# Patient Record
Sex: Male | Born: 1945
Health system: Southern US, Community
[De-identification: ages and names within clinical notes are randomized; demographics above are authoritative.]

## PROBLEM LIST (undated history)

## (undated) DIAGNOSIS — R0609 Other forms of dyspnea: Secondary | ICD-10-CM

## (undated) DIAGNOSIS — E785 Hyperlipidemia, unspecified: Secondary | ICD-10-CM

## (undated) DIAGNOSIS — N189 Chronic kidney disease, unspecified: Secondary | ICD-10-CM

## (undated) DIAGNOSIS — D72819 Decreased white blood cell count, unspecified: Secondary | ICD-10-CM

## (undated) DIAGNOSIS — D693 Immune thrombocytopenic purpura: Secondary | ICD-10-CM

## (undated) DIAGNOSIS — R300 Dysuria: Secondary | ICD-10-CM

## (undated) DIAGNOSIS — Z85819 Personal history of malignant neoplasm of unspecified site of lip, oral cavity, and pharynx: Secondary | ICD-10-CM

## (undated) DIAGNOSIS — R0600 Dyspnea, unspecified: Secondary | ICD-10-CM

## (undated) DIAGNOSIS — I10 Essential (primary) hypertension: Secondary | ICD-10-CM

## (undated) DIAGNOSIS — J439 Emphysema, unspecified: Secondary | ICD-10-CM

## (undated) HISTORY — DX: Dyspnea, unspecified: R06.00

## (undated) HISTORY — PX: LUNG SURGERY: SHX703

## (undated) HISTORY — DX: Other forms of dyspnea: R06.09

## (undated) HISTORY — PX: SKIN CANCER EXCISION: SHX779

## (undated) HISTORY — DX: Hyperlipidemia, unspecified: E78.5

## (undated) HISTORY — DX: Dysuria: R30.0

---

## 1998-03-12 HISTORY — PX: THROAT SURGERY: SHX803

## 2015-03-18 DIAGNOSIS — N400 Enlarged prostate without lower urinary tract symptoms: Secondary | ICD-10-CM | POA: Diagnosis not present

## 2015-03-18 DIAGNOSIS — E039 Hypothyroidism, unspecified: Secondary | ICD-10-CM | POA: Diagnosis not present

## 2015-03-18 DIAGNOSIS — Z85828 Personal history of other malignant neoplasm of skin: Secondary | ICD-10-CM | POA: Diagnosis not present

## 2015-03-18 DIAGNOSIS — N183 Chronic kidney disease, stage 3 (moderate): Secondary | ICD-10-CM | POA: Diagnosis not present

## 2015-03-18 DIAGNOSIS — M109 Gout, unspecified: Secondary | ICD-10-CM | POA: Diagnosis not present

## 2015-03-18 DIAGNOSIS — I10 Essential (primary) hypertension: Secondary | ICD-10-CM | POA: Diagnosis not present

## 2015-03-18 DIAGNOSIS — R739 Hyperglycemia, unspecified: Secondary | ICD-10-CM | POA: Diagnosis not present

## 2015-03-18 DIAGNOSIS — E785 Hyperlipidemia, unspecified: Secondary | ICD-10-CM | POA: Diagnosis not present

## 2015-05-18 DIAGNOSIS — L814 Other melanin hyperpigmentation: Secondary | ICD-10-CM | POA: Diagnosis not present

## 2015-05-18 DIAGNOSIS — D225 Melanocytic nevi of trunk: Secondary | ICD-10-CM | POA: Diagnosis not present

## 2015-05-18 DIAGNOSIS — L910 Hypertrophic scar: Secondary | ICD-10-CM | POA: Diagnosis not present

## 2015-05-18 DIAGNOSIS — L821 Other seborrheic keratosis: Secondary | ICD-10-CM | POA: Diagnosis not present

## 2015-05-18 DIAGNOSIS — D1801 Hemangioma of skin and subcutaneous tissue: Secondary | ICD-10-CM | POA: Diagnosis not present

## 2015-05-18 DIAGNOSIS — L738 Other specified follicular disorders: Secondary | ICD-10-CM | POA: Diagnosis not present

## 2015-05-18 DIAGNOSIS — L308 Other specified dermatitis: Secondary | ICD-10-CM | POA: Diagnosis not present

## 2015-05-18 DIAGNOSIS — L570 Actinic keratosis: Secondary | ICD-10-CM | POA: Diagnosis not present

## 2015-05-18 DIAGNOSIS — D229 Melanocytic nevi, unspecified: Secondary | ICD-10-CM | POA: Diagnosis not present

## 2015-05-18 DIAGNOSIS — D485 Neoplasm of uncertain behavior of skin: Secondary | ICD-10-CM | POA: Diagnosis not present

## 2015-09-22 DIAGNOSIS — H2513 Age-related nuclear cataract, bilateral: Secondary | ICD-10-CM | POA: Diagnosis not present

## 2015-09-26 DIAGNOSIS — H53143 Visual discomfort, bilateral: Secondary | ICD-10-CM | POA: Diagnosis not present

## 2015-11-18 DIAGNOSIS — D1801 Hemangioma of skin and subcutaneous tissue: Secondary | ICD-10-CM | POA: Diagnosis not present

## 2015-11-18 DIAGNOSIS — Z85820 Personal history of malignant melanoma of skin: Secondary | ICD-10-CM | POA: Diagnosis not present

## 2015-11-18 DIAGNOSIS — D225 Melanocytic nevi of trunk: Secondary | ICD-10-CM | POA: Diagnosis not present

## 2015-11-18 DIAGNOSIS — L438 Other lichen planus: Secondary | ICD-10-CM | POA: Diagnosis not present

## 2015-11-18 DIAGNOSIS — D2262 Melanocytic nevi of left upper limb, including shoulder: Secondary | ICD-10-CM | POA: Diagnosis not present

## 2015-11-18 DIAGNOSIS — Z85828 Personal history of other malignant neoplasm of skin: Secondary | ICD-10-CM | POA: Diagnosis not present

## 2015-11-18 DIAGNOSIS — L570 Actinic keratosis: Secondary | ICD-10-CM | POA: Diagnosis not present

## 2015-11-18 DIAGNOSIS — D692 Other nonthrombocytopenic purpura: Secondary | ICD-10-CM | POA: Diagnosis not present

## 2015-11-18 DIAGNOSIS — L821 Other seborrheic keratosis: Secondary | ICD-10-CM | POA: Diagnosis not present

## 2015-12-06 DIAGNOSIS — Z23 Encounter for immunization: Secondary | ICD-10-CM | POA: Diagnosis not present

## 2016-01-16 DIAGNOSIS — N183 Chronic kidney disease, stage 3 (moderate): Secondary | ICD-10-CM | POA: Diagnosis not present

## 2016-01-16 DIAGNOSIS — Z0000 Encounter for general adult medical examination without abnormal findings: Secondary | ICD-10-CM | POA: Diagnosis not present

## 2016-01-16 DIAGNOSIS — N400 Enlarged prostate without lower urinary tract symptoms: Secondary | ICD-10-CM | POA: Diagnosis not present

## 2016-01-16 DIAGNOSIS — E039 Hypothyroidism, unspecified: Secondary | ICD-10-CM | POA: Diagnosis not present

## 2016-01-16 DIAGNOSIS — Z8501 Personal history of malignant neoplasm of esophagus: Secondary | ICD-10-CM | POA: Diagnosis not present

## 2016-01-16 DIAGNOSIS — I10 Essential (primary) hypertension: Secondary | ICD-10-CM | POA: Diagnosis not present

## 2016-01-16 DIAGNOSIS — E785 Hyperlipidemia, unspecified: Secondary | ICD-10-CM | POA: Diagnosis not present

## 2016-01-16 DIAGNOSIS — M109 Gout, unspecified: Secondary | ICD-10-CM | POA: Diagnosis not present

## 2016-01-16 DIAGNOSIS — Z85828 Personal history of other malignant neoplasm of skin: Secondary | ICD-10-CM | POA: Diagnosis not present

## 2016-02-08 DIAGNOSIS — E785 Hyperlipidemia, unspecified: Secondary | ICD-10-CM | POA: Diagnosis not present

## 2016-02-08 DIAGNOSIS — E039 Hypothyroidism, unspecified: Secondary | ICD-10-CM | POA: Diagnosis not present

## 2016-02-08 DIAGNOSIS — I129 Hypertensive chronic kidney disease with stage 1 through stage 4 chronic kidney disease, or unspecified chronic kidney disease: Secondary | ICD-10-CM | POA: Diagnosis not present

## 2016-02-08 DIAGNOSIS — E669 Obesity, unspecified: Secondary | ICD-10-CM | POA: Diagnosis not present

## 2016-02-08 DIAGNOSIS — N183 Chronic kidney disease, stage 3 (moderate): Secondary | ICD-10-CM | POA: Diagnosis not present

## 2016-04-11 DIAGNOSIS — L821 Other seborrheic keratosis: Secondary | ICD-10-CM | POA: Diagnosis not present

## 2016-04-11 DIAGNOSIS — Z85828 Personal history of other malignant neoplasm of skin: Secondary | ICD-10-CM | POA: Diagnosis not present

## 2016-04-11 DIAGNOSIS — L820 Inflamed seborrheic keratosis: Secondary | ICD-10-CM | POA: Diagnosis not present

## 2016-04-22 ENCOUNTER — Inpatient Hospital Stay (HOSPITAL_COMMUNITY)
Admission: EM | Admit: 2016-04-22 | Discharge: 2016-04-25 | DRG: 871 | Disposition: A | Discharge status: Home / Self Care | Payer: Medicare PPO | Attending: Family Medicine | Admitting: Family Medicine

## 2016-04-22 ENCOUNTER — Encounter (HOSPITAL_COMMUNITY): Payer: Self-pay | Admitting: Emergency Medicine

## 2016-04-22 ENCOUNTER — Emergency Department (HOSPITAL_COMMUNITY): Payer: Medicare PPO

## 2016-04-22 DIAGNOSIS — R7989 Other specified abnormal findings of blood chemistry: Secondary | ICD-10-CM

## 2016-04-22 DIAGNOSIS — I129 Hypertensive chronic kidney disease with stage 1 through stage 4 chronic kidney disease, or unspecified chronic kidney disease: Secondary | ICD-10-CM | POA: Diagnosis present

## 2016-04-22 DIAGNOSIS — Z87891 Personal history of nicotine dependence: Secondary | ICD-10-CM | POA: Diagnosis not present

## 2016-04-22 DIAGNOSIS — Z85819 Personal history of malignant neoplasm of unspecified site of lip, oral cavity, and pharynx: Secondary | ICD-10-CM | POA: Diagnosis not present

## 2016-04-22 DIAGNOSIS — N183 Chronic kidney disease, stage 3 (moderate): Secondary | ICD-10-CM | POA: Diagnosis present

## 2016-04-22 DIAGNOSIS — N390 Urinary tract infection, site not specified: Secondary | ICD-10-CM | POA: Diagnosis present

## 2016-04-22 DIAGNOSIS — Z79899 Other long term (current) drug therapy: Secondary | ICD-10-CM

## 2016-04-22 DIAGNOSIS — R319 Hematuria, unspecified: Secondary | ICD-10-CM | POA: Diagnosis present

## 2016-04-22 DIAGNOSIS — B9620 Unspecified Escherichia coli [E. coli] as the cause of diseases classified elsewhere: Secondary | ICD-10-CM

## 2016-04-22 DIAGNOSIS — J439 Emphysema, unspecified: Secondary | ICD-10-CM | POA: Diagnosis present

## 2016-04-22 DIAGNOSIS — A419 Sepsis, unspecified organism: Secondary | ICD-10-CM | POA: Diagnosis present

## 2016-04-22 DIAGNOSIS — R7881 Bacteremia: Secondary | ICD-10-CM

## 2016-04-22 DIAGNOSIS — D649 Anemia, unspecified: Secondary | ICD-10-CM | POA: Diagnosis present

## 2016-04-22 DIAGNOSIS — D696 Thrombocytopenia, unspecified: Secondary | ICD-10-CM

## 2016-04-22 DIAGNOSIS — N189 Chronic kidney disease, unspecified: Secondary | ICD-10-CM | POA: Diagnosis not present

## 2016-04-22 DIAGNOSIS — R300 Dysuria: Secondary | ICD-10-CM | POA: Diagnosis present

## 2016-04-22 DIAGNOSIS — R6521 Severe sepsis with septic shock: Secondary | ICD-10-CM | POA: Diagnosis present

## 2016-04-22 DIAGNOSIS — R778 Other specified abnormalities of plasma proteins: Secondary | ICD-10-CM

## 2016-04-22 DIAGNOSIS — N3001 Acute cystitis with hematuria: Secondary | ICD-10-CM | POA: Diagnosis not present

## 2016-04-22 DIAGNOSIS — N3000 Acute cystitis without hematuria: Secondary | ICD-10-CM | POA: Diagnosis not present

## 2016-04-22 DIAGNOSIS — R509 Fever, unspecified: Secondary | ICD-10-CM | POA: Diagnosis not present

## 2016-04-22 HISTORY — DX: Chronic kidney disease, unspecified: N18.9

## 2016-04-22 HISTORY — DX: Essential (primary) hypertension: I10

## 2016-04-22 HISTORY — DX: Personal history of malignant neoplasm of unspecified site of lip, oral cavity, and pharynx: Z85.819

## 2016-04-22 HISTORY — DX: Emphysema, unspecified: J43.9

## 2016-04-22 LAB — URINALYSIS, ROUTINE W REFLEX MICROSCOPIC
BILIRUBIN URINE: NEGATIVE
GLUCOSE, UA: NEGATIVE mg/dL
KETONES UR: 5 mg/dL — AB
NITRITE: POSITIVE — AB
PH: 5 (ref 5.0–8.0)
PROTEIN: 30 mg/dL — AB
Specific Gravity, Urine: 1.023 (ref 1.005–1.030)

## 2016-04-22 LAB — COMPREHENSIVE METABOLIC PANEL
ALBUMIN: 4.2 g/dL (ref 3.5–5.0)
ALK PHOS: 60 U/L (ref 38–126)
ALT: 22 U/L (ref 17–63)
AST: 26 U/L (ref 15–41)
Anion gap: 13 (ref 5–15)
BUN: 30 mg/dL — AB (ref 6–20)
CALCIUM: 9.6 mg/dL (ref 8.9–10.3)
CHLORIDE: 105 mmol/L (ref 101–111)
CO2: 20 mmol/L — AB (ref 22–32)
CREATININE: 1.51 mg/dL — AB (ref 0.61–1.24)
GFR calc non Af Amer: 44 mL/min — ABNORMAL LOW (ref >60)
GFR, EST AFRICAN AMERICAN: 51 mL/min — AB (ref >60)
GLUCOSE: 132 mg/dL — AB (ref 65–99)
Potassium: 3.7 mmol/L (ref 3.5–5.1)
SODIUM: 138 mmol/L (ref 135–145)
Total Bilirubin: 1.3 mg/dL — ABNORMAL HIGH (ref 0.3–1.2)
Total Protein: 7.6 g/dL (ref 6.5–8.1)

## 2016-04-22 LAB — CBC WITH DIFFERENTIAL/PLATELET
BASOS ABS: 0 10*3/uL (ref 0.0–0.1)
Basophils Relative: 0 %
EOS PCT: 0 %
Eosinophils Absolute: 0 10*3/uL (ref 0.0–0.7)
HEMATOCRIT: 42.3 % (ref 39.0–52.0)
Hemoglobin: 15.5 g/dL (ref 13.0–17.0)
LYMPHS ABS: 0.2 10*3/uL — AB (ref 0.7–4.0)
LYMPHS PCT: 6 %
MCH: 32.2 pg (ref 26.0–34.0)
MCHC: 36.6 g/dL — ABNORMAL HIGH (ref 30.0–36.0)
MCV: 87.8 fL (ref 78.0–100.0)
MONO ABS: 0.1 10*3/uL (ref 0.1–1.0)
Monocytes Relative: 2 %
NEUTROS ABS: 3.5 10*3/uL (ref 1.7–7.7)
Neutrophils Relative %: 93 %
Platelets: 133 10*3/uL — ABNORMAL LOW (ref 150–400)
RBC: 4.82 MIL/uL (ref 4.22–5.81)
RDW: 12.8 % (ref 11.5–15.5)
WBC: 3.8 10*3/uL — ABNORMAL LOW (ref 4.0–10.5)

## 2016-04-22 LAB — I-STAT CG4 LACTIC ACID, ED
Lactic Acid, Venous: 3.57 mmol/L (ref 0.5–1.9)
Lactic Acid, Venous: 1.73 mmol/L (ref 0.5–1.9)

## 2016-04-22 LAB — PROTIME-INR
INR: 1.05
Prothrombin Time: 13.8 seconds (ref 11.4–15.2)

## 2016-04-22 MED ORDER — NOREPINEPHRINE BITARTRATE 1 MG/ML IV SOLN: 2.0000 ug/kg/min | Freq: Once | INTRAVENOUS | Status: DC

## 2016-04-22 MED ORDER — SODIUM CHLORIDE 0.9 % IV BOLUS (SEPSIS)
1000.0000 mL | Freq: Once | INTRAVENOUS | Status: AC
  Administered 2016-04-22: 1000 mL via INTRAVENOUS

## 2016-04-22 MED ORDER — VANCOMYCIN HCL IN DEXTROSE 1-5 GM/200ML-% IV SOLN
1000.0000 mg | INTRAVENOUS | Status: AC
  Administered 2016-04-22: 1000 mg via INTRAVENOUS
  Filled 2016-04-22: qty 200

## 2016-04-22 MED ORDER — SODIUM CHLORIDE 0.9 % IV BOLUS (SEPSIS)
2000.0000 mL | Freq: Once | INTRAVENOUS | Status: AC
  Administered 2016-04-22: 2000 mL via INTRAVENOUS

## 2016-04-22 MED ORDER — NOREPINEPHRINE BITARTRATE 1 MG/ML IV SOLN
0.0000 ug/min | INTRAVENOUS | Status: DC
  Administered 2016-04-22: 4 ug/min via INTRAVENOUS
  Filled 2016-04-22: qty 4

## 2016-04-22 MED ORDER — DEXTROSE 5 % IV SOLN
1.0000 g | Freq: Three times a day (TID) | INTRAVENOUS | Status: DC
  Administered 2016-04-23 (×2): 1 g via INTRAVENOUS
  Filled 2016-04-22 (×2): qty 1

## 2016-04-22 MED ORDER — VANCOMYCIN HCL IN DEXTROSE 750-5 MG/150ML-% IV SOLN: 750.0000 mg | Freq: Two times a day (BID) | INTRAVENOUS | Status: DC

## 2016-04-22 MED ORDER — DEXTROSE 5 % IV SOLN
2.0000 g | Freq: Once | INTRAVENOUS | Status: AC
  Administered 2016-04-22: 2 g via INTRAVENOUS
  Filled 2016-04-22: qty 2

## 2016-04-22 NOTE — ED Notes (Signed)
3rd request for urine patient unable to provide one

## 2016-04-22 NOTE — ED Notes (Signed)
Pt reports he is unable to urinate at present time. 

## 2016-04-22 NOTE — Progress Notes (Signed)
Pharmacy Antibiotic Note  Karna ChristmasRichard Javier is a 71 y.o. male admitted on 04/22/2016 with UTI/sepsis.   Pharmacy has been consulted for Cefepime/Vancomycin dosing. He is febrile (Tm 102.20F) with elevated LA and complaints of dysuria.     Plan: Cefepime 1gm IV q8h Vancomycin 1 gm IV x1 then 750 mg IV q12h VT=15-20 mg/L Monitor renal function and cx data   Height: 5\' 11"  (180.3 cm) Weight: 210 lb (95.3 kg) IBW/kg (Calculated) : 75.3  Temp (24hrs), Avg:102.4 F (39.1 C), Min:102.4 F (39.1 C), Max:102.4 F (39.1 C)   Recent Labs Lab 04/22/16 1841 04/22/16 1842 04/22/16 2149  WBC  --  3.8*  --   CREATININE  --  1.51*  --   LATICACIDVEN 3.57*  --  1.73    Estimated Creatinine Clearance: 52.1 mL/min (by C-G formula based on SCr of 1.51 mg/dL (H)).    No Known Allergies  Antimicrobials this admission: 2/11 Cefepime >>  2/11 Vancomycin >>  Dose adjustments this admission:  Microbiology results: 2/11 BCx: IP 2/11 UCx: ordered   Thank you for allowing pharmacy to be a part of this patient's care.  Lorenza EvangelistGreen, Corrisa Gibby R 04/22/2016 10:45 PM

## 2016-04-22 NOTE — Progress Notes (Signed)
Pharmacy Antibiotic Note  Vincent ChristmasRichard Coleman is a 71 y.o. male admitted on 04/22/2016 with UTI.  Pharmacy has been consulted for Cefepime dosing. He is febrile (Tm 102.41F) with elevated LA and complaints of dysuria.     Plan: Cefepime 1gm IV q8h Monitor renal function and cx data   Weight: 212 lb (96.2 kg)  Temp (24hrs), Avg:102.4 F (39.1 C), Min:102.4 F (39.1 C), Max:102.4 F (39.1 C)   Recent Labs Lab 04/22/16 1841 04/22/16 1842  WBC  --  3.8*  CREATININE  --  1.51*  LATICACIDVEN 3.57*  --     CrCl cannot be calculated (Unknown ideal weight.).    No Known Allergies  Antimicrobials this admission: 2/11 Cefepime >>   Dose adjustments this admission:  Microbiology results: 2/11 BCx: IP 2/11 UCx: ordered   Thank you for allowing pharmacy to be a part of this patient's care.  Elson ClanLilliston, Lougenia Morrissey Michelle 04/22/2016 7:49 PM

## 2016-04-22 NOTE — ED Notes (Signed)
Patient givenw

## 2016-04-22 NOTE — ED Notes (Signed)
BLADDER SCAN PREFORMED RESULTING IN 170mL's

## 2016-04-22 NOTE — ED Notes (Signed)
Bed: WA17 Expected date:  Expected time:  Means of arrival:  Comments: 71 yo dysuria, fever-given Tylenol en route

## 2016-04-22 NOTE — ED Notes (Addendum)
Bladder scan results: 0 mL 

## 2016-04-22 NOTE — ED Provider Notes (Signed)
[facility]-EMERGENCY DEPT Provider Note   CSN: [id number] Arrival date & time: 04/22/16 1818     History    Chief Complaint  Patient presents with  . Dysuria     HPI Vincent Coleman is a 71 y.o. male.  71yo M w/ HTN And emphysema who presents with malaise, fever, and dysuria. Yesterday he began feeling unwell and this progressed throughout the night. He began having severe dysuria today. He denies any hematuria or associated back or abdominal pain. He was noted to be febrile by EMS and received 1 g Tylenol in transport. He denies any cough/cold symptoms, new shortness of breath, or chest pain.   Past Medical History:  Diagnosis Date  . Emphysema of lung (HCC)   . Hypertension      There are no active problems to display for this patient.   History reviewed. No pertinent surgical history.      Home Medications    Prior to Admission medications   Medication Sig Start Date End Date Taking? Authorizing Provider  levothyroxine (SYNTHROID, LEVOTHROID) 137 MCG tablet Take 137 mcg by mouth daily. 03/12/16  Yes Historical Provider, MD  lisinopril-hydrochlorothiazide (PRINZIDE,ZESTORETIC) 20-12.5 MG tablet Take 1 tablet by mouth daily. 02/14/16  Yes Historical Provider, MD  omeprazole (PRILOSEC) 20 MG capsule Take 20 mg by mouth 2 (two) times daily. 03/12/16  Yes Historical Provider, MD  Pseudoeph-Doxylamine-DM-APAP (NYQUIL PO) Take 30 mLs by mouth as needed (sleep, sore throat).   Yes Historical Provider, MD  rosuvastatin (CRESTOR) 40 MG tablet Take 40 mg by mouth daily. 03/12/16  Yes Historical Provider, MD  VENTOLIN HFA 108 (90 Base) MCG/ACT inhaler Take 2 puffs by mouth every 6 (six) hours. 01/16/16  Yes Historical Provider, MD      History reviewed. No pertinent family history.   Social History  Substance Use Topics  . Smoking status: Former Games developer  . Smokeless tobacco: Never Used  . Alcohol use No     Allergies     Patient has no known allergies.    Review of  Systems  10 Systems reviewed and are negative for acute change except as noted in the HPI.   Physical Exam Updated Vital Signs BP 132/74   Pulse 109   Temp 102.4 F (39.1 C) (Oral)   Resp 23   Ht 5\' 11"  (1.803 m)   Wt 210 lb (95.3 kg)   SpO2 98%   BMI 29.29 kg/m   Physical Exam  Constitutional: He is oriented to person, place, and time. He appears well-developed and well-nourished. No distress.  HENT:  Head: Normocephalic and atraumatic.  dry mucous membranes  Eyes: Conjunctivae are normal. Pupils are equal, round, and reactive to light.  Neck: Neck supple.  Cardiovascular: Regular rhythm and normal heart sounds.  Tachycardia present.   No murmur heard. Pulmonary/Chest: Effort normal and breath sounds normal.  Abdominal: Soft. Bowel sounds are normal. He exhibits no distension. There is no tenderness.  Musculoskeletal: He exhibits no edema.  Neurological: He is alert and oriented to person, place, and time.  Fluent speech  Skin: Skin is warm and dry. There is pallor.  Psychiatric: He has a normal mood and affect. Judgment normal.  Nursing note and vitals reviewed.     ED Treatments / Results  Labs (all labs ordered are listed, but only abnormal results are displayed) Labs Reviewed  COMPREHENSIVE METABOLIC PANEL - Abnormal; Notable for the following:       Result Value   CO2 20 (*)  Glucose, Bld 132 (*)    BUN 30 (*)    Creatinine, Ser 1.51 (*)    Total Bilirubin 1.3 (*)    GFR calc non Af Amer 44 (*)    GFR calc Af Amer 51 (*)    All other components within normal limits  CBC WITH DIFFERENTIAL/PLATELET - Abnormal; Notable for the following:    WBC 3.8 (*)    MCHC 36.6 (*)    Platelets 133 (*)    Lymphs Abs 0.2 (*)    All other components within normal limits  URINALYSIS, ROUTINE W REFLEX MICROSCOPIC - Abnormal; Notable for the following:    APPearance HAZY (*)    Hgb urine dipstick MODERATE (*)    Ketones, ur 5 (*)    Protein, ur 30 (*)    Nitrite  POSITIVE (*)    Leukocytes, UA LARGE (*)    Bacteria, UA RARE (*)    Squamous Epithelial / LPF 0-5 (*)    All other components within normal limits  I-STAT CG4 LACTIC ACID, ED - Abnormal; Notable for the following:    Lactic Acid, Venous 3.57 (*)    All other components within normal limits  I-STAT TROPOININ, ED - Abnormal; Notable for the following:    Troponin i, poc 0.21 (*)    All other components within normal limits  CULTURE, BLOOD (ROUTINE X 2)  CULTURE, BLOOD (ROUTINE X 2)  URINE CULTURE  PROTIME-INR  I-STAT CG4 LACTIC ACID, ED     EKG  EKG Interpretation  Date/Time:    Ventricular Rate:    PR Interval:    QRS Duration:   QT Interval:    QTC Calculation:   R Axis:     Text Interpretation:           Radiology Dg Chest 2 View  Result Date: 04/22/2016 CLINICAL DATA:  Fever for a few days. History of emphysema and hypertension. Evaluate possible sepsis. EXAM: CHEST  2 VIEW COMPARISON:  None. FINDINGS: Cardiomediastinal silhouette is normal. No pleural effusions or focal consolidations. LEFT hilar surgical clips. Trachea projects midline and there is no pneumothorax. Soft tissue planes and included osseous structures are non-suspicious. Old moderate approximate L1 compression fracture. Mild degenerative change of thoracic spine. IMPRESSION: No acute cardiopulmonary process. Electronically Signed   By: Awilda Metroourtnay  Bloomer M.D.   On: 04/22/2016 19:11    Procedures Procedures (including critical care time) .Critical Care Performed by: Laurence SpatesLITTLE, Alaster Asfaw MORGAN Authorized by: Laurence SpatesLITTLE, Stancil Deisher MORGAN   Critical care provider statement:    Critical care time (minutes):  75   Critical care time was exclusive of:  Separately billable procedures and treating other patients   Critical care was necessary to treat or prevent imminent or life-threatening deterioration of the following conditions:  Sepsis   Critical care was time spent personally by me on the following activities:   Development of treatment plan with patient or surrogate, discussions with consultants, evaluation of patient's response to treatment, examination of patient, obtaining history from patient or surrogate, ordering and performing treatments and interventions, ordering and review of laboratory studies, ordering and review of radiographic studies, re-evaluation of patient's condition and review of old charts    Medications Ordered in ED  Medications  ceFEPIme (MAXIPIME) 1 g in dextrose 5 % 50 mL IVPB (not administered)  norepinephrine (LEVOPHED) 4 mg in dextrose 5 % 250 mL (0.016 mg/mL) infusion (4 mcg/min Intravenous New Bag/Given 04/22/16 2235)  vancomycin (VANCOCIN) IVPB 750 mg/150 ml premix (not administered)  ceFEPIme (MAXIPIME) 2 g in dextrose 5 % 50 mL IVPB (0 g Intravenous Stopped 04/22/16 1937)  sodium chloride 0.9 % bolus 1,000 mL (0 mLs Intravenous Stopped 04/22/16 2138)  sodium chloride 0.9 % bolus 2,000 mL (0 mLs Intravenous Stopped 04/22/16 2015)  sodium chloride 0.9 % bolus 1,000 mL (1,000 mLs Intravenous New Bag/Given 04/22/16 2143)  vancomycin (VANCOCIN) IVPB 1000 mg/200 mL premix (0 mg Intravenous Stopped 04/22/16 2354)     Initial Impression / Assessment and Plan / ED Course  I have reviewed the triage vital signs and the nursing notes.  Pertinent labs & imaging results that were available during my care of the patient were reviewed by me and considered in my medical decision making (see chart for details).     Pt p/w fever associated with malaise and dysuria over the past 24 hours. Initial temp 102.4, heart rate 135, BP 119/73. Normal work of breathing and clear breath sounds. Initiated a code sepsis based on fever and gave cefepime to cover pyelonephritis given his dysuria history. Also gave 3L IVF. Initial lactate 3.57. Creatinine 1.51, WBC 3.8, normal hemoglobin. Chest x-ray negative acute.  After 3 L of fluid, the patient remained hypotensive. Gave a fourth liter but he  remained persistently hypotensive and had not produced urine sample. I added vancomycin for broader coverage. Discussed with critical care, Dr. Jamison Neighbor, who recommended 5th L. Gave 5th liter and also began levophed for pressure support. After completion of fifth liter and antibiotics, the patient had produced a urine sample that does confirm UTI. I was later able to titrate the patient off of Levophed. Discussed again w/ CCM, Dr. Delton Coombes, who recommended monitoring blood pressure as if he remains stable he would be appropriate for stepdown.  1:27 AM Pt's BP has remained normal for at least 30 min off levophed and he is mentating well. Discussed admission w/ hospitalist, Dr. Julian Reil, who will admit for further care. Family updated on work up and plan.  Final Clinical Impressions(s) / ED Diagnoses   Final diagnoses:  Sepsis, due to unspecified organism [facility])  Urinary tract infection with hematuria, site unspecified     New Prescriptions   No medications on file       Laurence Spates, MD 04/23/16 [phone]

## 2016-04-22 NOTE — ED Notes (Signed)
In and out placed with no out put

## 2016-04-22 NOTE — ED Triage Notes (Signed)
Pt from home. Reports he began to feel unwell yesterday and got worse throughout the night. Began to have dysuria and bilateral flank pain this am. Febrile with EMS, given 1000mg  acetaminophen by EMS.

## 2016-04-22 NOTE — ED Notes (Signed)
Patient transported to X-ray 

## 2016-04-22 NOTE — ED Notes (Signed)
MADE 2ND REQUEST FOR URINE PATIENT NOT ABLE TO PROVIDE ONE

## 2016-04-22 NOTE — ED Notes (Signed)
Condom cath puy in place waiting for out put

## 2016-04-22 NOTE — ED Notes (Signed)
Made first attempt to collect urine sample, patient unable to provide one at this time.

## 2016-04-23 ENCOUNTER — Inpatient Hospital Stay (HOSPITAL_COMMUNITY): Payer: Medicare PPO

## 2016-04-23 ENCOUNTER — Encounter (HOSPITAL_COMMUNITY): Payer: Self-pay | Admitting: Internal Medicine

## 2016-04-23 DIAGNOSIS — D696 Thrombocytopenia, unspecified: Secondary | ICD-10-CM | POA: Diagnosis present

## 2016-04-23 DIAGNOSIS — Z87891 Personal history of nicotine dependence: Secondary | ICD-10-CM | POA: Diagnosis not present

## 2016-04-23 DIAGNOSIS — N183 Chronic kidney disease, stage 3 (moderate): Secondary | ICD-10-CM

## 2016-04-23 DIAGNOSIS — B9620 Unspecified Escherichia coli [E. coli] as the cause of diseases classified elsewhere: Secondary | ICD-10-CM | POA: Diagnosis present

## 2016-04-23 DIAGNOSIS — N189 Chronic kidney disease, unspecified: Secondary | ICD-10-CM | POA: Diagnosis not present

## 2016-04-23 DIAGNOSIS — R7881 Bacteremia: Secondary | ICD-10-CM | POA: Diagnosis not present

## 2016-04-23 DIAGNOSIS — R7989 Other specified abnormal findings of blood chemistry: Secondary | ICD-10-CM | POA: Diagnosis not present

## 2016-04-23 DIAGNOSIS — D649 Anemia, unspecified: Secondary | ICD-10-CM | POA: Diagnosis present

## 2016-04-23 DIAGNOSIS — R319 Hematuria, unspecified: Secondary | ICD-10-CM | POA: Diagnosis present

## 2016-04-23 DIAGNOSIS — A419 Sepsis, unspecified organism: Secondary | ICD-10-CM | POA: Diagnosis present

## 2016-04-23 DIAGNOSIS — I129 Hypertensive chronic kidney disease with stage 1 through stage 4 chronic kidney disease, or unspecified chronic kidney disease: Secondary | ICD-10-CM | POA: Diagnosis present

## 2016-04-23 DIAGNOSIS — J439 Emphysema, unspecified: Secondary | ICD-10-CM | POA: Diagnosis present

## 2016-04-23 DIAGNOSIS — N3001 Acute cystitis with hematuria: Secondary | ICD-10-CM | POA: Diagnosis not present

## 2016-04-23 DIAGNOSIS — N390 Urinary tract infection, site not specified: Secondary | ICD-10-CM | POA: Diagnosis present

## 2016-04-23 DIAGNOSIS — Z79899 Other long term (current) drug therapy: Secondary | ICD-10-CM | POA: Diagnosis not present

## 2016-04-23 DIAGNOSIS — R6521 Severe sepsis with septic shock: Secondary | ICD-10-CM

## 2016-04-23 DIAGNOSIS — N3000 Acute cystitis without hematuria: Secondary | ICD-10-CM | POA: Diagnosis not present

## 2016-04-23 DIAGNOSIS — Z85819 Personal history of malignant neoplasm of unspecified site of lip, oral cavity, and pharynx: Secondary | ICD-10-CM | POA: Diagnosis not present

## 2016-04-23 DIAGNOSIS — R300 Dysuria: Secondary | ICD-10-CM | POA: Diagnosis present

## 2016-04-23 LAB — ECHOCARDIOGRAM COMPLETE
Height: 71 in
Weight: 3552.05 oz

## 2016-04-23 LAB — BLOOD CULTURE ID PANEL (REFLEXED)
Acinetobacter baumannii: NOT DETECTED
CANDIDA GLABRATA: NOT DETECTED
Candida albicans: NOT DETECTED
Candida krusei: NOT DETECTED
Candida parapsilosis: NOT DETECTED
Candida tropicalis: NOT DETECTED
Carbapenem resistance: NOT DETECTED
ENTEROCOCCUS SPECIES: NOT DETECTED
ESCHERICHIA COLI: DETECTED — AB
Enterobacter cloacae complex: NOT DETECTED
Enterobacteriaceae species: DETECTED — AB
Haemophilus influenzae: NOT DETECTED
Klebsiella oxytoca: NOT DETECTED
Klebsiella pneumoniae: NOT DETECTED
LISTERIA MONOCYTOGENES: NOT DETECTED
NEISSERIA MENINGITIDIS: NOT DETECTED
Proteus species: NOT DETECTED
Pseudomonas aeruginosa: NOT DETECTED
SERRATIA MARCESCENS: NOT DETECTED
STAPHYLOCOCCUS AUREUS BCID: NOT DETECTED
STAPHYLOCOCCUS SPECIES: NOT DETECTED
STREPTOCOCCUS AGALACTIAE: NOT DETECTED
STREPTOCOCCUS PNEUMONIAE: NOT DETECTED
STREPTOCOCCUS SPECIES: NOT DETECTED
Streptococcus pyogenes: NOT DETECTED

## 2016-04-23 LAB — I-STAT TROPONIN, ED: Troponin i, poc: 0.21 ng/mL (ref 0.00–0.08)

## 2016-04-23 LAB — BASIC METABOLIC PANEL
ANION GAP: 7 (ref 5–15)
BUN: 22 mg/dL — ABNORMAL HIGH (ref 6–20)
CALCIUM: 8.1 mg/dL — AB (ref 8.9–10.3)
CO2: 22 mmol/L (ref 22–32)
CREATININE: 1.26 mg/dL — AB (ref 0.61–1.24)
Chloride: 109 mmol/L (ref 101–111)
GFR, EST NON AFRICAN AMERICAN: 55 mL/min — AB (ref >60)
Glucose, Bld: 120 mg/dL — ABNORMAL HIGH (ref 65–99)
Potassium: 4.7 mmol/L (ref 3.5–5.1)
Sodium: 138 mmol/L (ref 135–145)

## 2016-04-23 LAB — CBC
HEMATOCRIT: 38.1 % — AB (ref 39.0–52.0)
Hemoglobin: 13.3 g/dL (ref 13.0–17.0)
MCH: 31.1 pg (ref 26.0–34.0)
MCHC: 34.9 g/dL (ref 30.0–36.0)
MCV: 89 fL (ref 78.0–100.0)
PLATELETS: 129 10*3/uL — AB (ref 150–400)
RBC: 4.28 MIL/uL (ref 4.22–5.81)
RDW: 13.3 % (ref 11.5–15.5)
WBC: 8.7 10*3/uL (ref 4.0–10.5)

## 2016-04-23 LAB — MRSA PCR SCREENING: MRSA by PCR: NEGATIVE

## 2016-04-23 LAB — TROPONIN I
TROPONIN I: 0.2 ng/mL — AB (ref <0.03)
TROPONIN I: 0.22 ng/mL — AB (ref <0.03)
TROPONIN I: 0.19 ng/mL — AB (ref <0.03)

## 2016-04-23 MED ORDER — LIP MEDEX EX OINT
TOPICAL_OINTMENT | CUTANEOUS | Status: DC | PRN
  Filled 2016-04-23: qty 7

## 2016-04-23 MED ORDER — SODIUM CHLORIDE 0.9 % IV SOLN
INTRAVENOUS | Status: DC
  Administered 2016-04-23: 14:00:00 via INTRAVENOUS
  Administered 2016-04-23: 125 mL via INTRAVENOUS
  Administered 2016-04-24: 03:00:00 via INTRAVENOUS

## 2016-04-23 MED ORDER — ZOLPIDEM TARTRATE 5 MG PO TABS
5.0000 mg | ORAL_TABLET | Freq: Every evening | ORAL | Status: DC | PRN
  Administered 2016-04-23 – 2016-04-24 (×2): 5 mg via ORAL
  Filled 2016-04-23 (×2): qty 1

## 2016-04-23 MED ORDER — URELLE 81 MG PO TABS
1.0000 | ORAL_TABLET | Freq: Three times a day (TID) | ORAL | Status: DC | PRN
  Filled 2016-04-23: qty 1

## 2016-04-23 MED ORDER — HYDRALAZINE HCL 20 MG/ML IJ SOLN: 10.0000 mg | INTRAMUSCULAR | Status: DC | PRN

## 2016-04-23 MED ORDER — DEXTROSE 5 % IV SOLN
2.0000 g | INTRAVENOUS | Status: DC
  Administered 2016-04-23 – 2016-04-24 (×2): 2 g via INTRAVENOUS
  Filled 2016-04-23 (×3): qty 2

## 2016-04-23 MED ORDER — ASPIRIN EC 325 MG PO TBEC
325.0000 mg | DELAYED_RELEASE_TABLET | Freq: Once | ORAL | Status: AC
  Administered 2016-04-23: 325 mg via ORAL
  Filled 2016-04-23: qty 1

## 2016-04-23 MED ORDER — IPRATROPIUM-ALBUTEROL 0.5-2.5 (3) MG/3ML IN SOLN: 3.0000 mL | RESPIRATORY_TRACT | Status: DC | PRN

## 2016-04-23 MED ORDER — ROSUVASTATIN CALCIUM 20 MG PO TABS
40.0000 mg | ORAL_TABLET | Freq: Every day | ORAL | Status: DC
  Administered 2016-04-23 – 2016-04-24 (×2): 40 mg via ORAL
  Filled 2016-04-23 (×2): qty 2

## 2016-04-23 MED ORDER — HYDRALAZINE HCL 20 MG/ML IJ SOLN
5.0000 mg | Freq: Once | INTRAMUSCULAR | Status: AC
  Administered 2016-04-23: 5 mg via INTRAVENOUS
  Filled 2016-04-23: qty 1

## 2016-04-23 MED ORDER — ASPIRIN EC 81 MG PO TBEC
81.0000 mg | DELAYED_RELEASE_TABLET | Freq: Every day | ORAL | Status: DC
  Administered 2016-04-24 – 2016-04-25 (×2): 81 mg via ORAL
  Filled 2016-04-23 (×2): qty 1

## 2016-04-23 MED ORDER — ENOXAPARIN SODIUM 40 MG/0.4ML ~~LOC~~ SOLN
40.0000 mg | SUBCUTANEOUS | Status: DC
  Administered 2016-04-23 – 2016-04-25 (×3): 40 mg via SUBCUTANEOUS
  Filled 2016-04-23 (×3): qty 0.4

## 2016-04-23 MED ORDER — LISINOPRIL 20 MG PO TABS
20.0000 mg | ORAL_TABLET | Freq: Every day | ORAL | Status: DC
  Administered 2016-04-23 – 2016-04-25 (×3): 20 mg via ORAL
  Filled 2016-04-23: qty 2
  Filled 2016-04-23 (×2): qty 1
  Filled 2016-04-23: qty 2

## 2016-04-23 MED ORDER — PHENAZOPYRIDINE HCL 100 MG PO TABS
100.0000 mg | ORAL_TABLET | Freq: Three times a day (TID) | ORAL | Status: AC
Start: 2016-04-23 — End: 2016-04-24
  Administered 2016-04-23 (×2): 100 mg via ORAL
  Filled 2016-04-23 (×4): qty 1

## 2016-04-23 MED ORDER — LEVOTHYROXINE SODIUM 25 MCG PO TABS
137.0000 ug | ORAL_TABLET | Freq: Every day | ORAL | Status: DC
  Administered 2016-04-23 – 2016-04-25 (×3): 137 ug via ORAL
  Filled 2016-04-23 (×3): qty 1

## 2016-04-23 MED ORDER — PANTOPRAZOLE SODIUM 40 MG PO TBEC
40.0000 mg | DELAYED_RELEASE_TABLET | Freq: Every day | ORAL | Status: DC
  Administered 2016-04-23 – 2016-04-25 (×3): 40 mg via ORAL
  Filled 2016-04-23 (×3): qty 1

## 2016-04-23 NOTE — Care Management Note (Signed)
Case Management Note  Patient Details  Name: Karna ChristmasRichard Gruwell MRN: [id number] Date of Birth: [birth year]  Subjective/Objective:        sepsis            Action/Plan:Date:  April 23, 2016 Chart reviewed for concurrent status and case management needs. Will continue to follow patient progress. Discharge Planning: following for needs Expected discharge date: [id number] Marcelle SmilingRhonda Davis, BSN, AnthonyRN3, [place]   [phone]  Expected Discharge Date:                  Expected Discharge Plan:  Home/Self Care  In-House Referral:     Discharge planning Services     Post Acute Care Choice:    Choice offered to:     DME Arranged:    DME Agency:     HH Arranged:    HH Agency:     Status of Service:  In process, will continue to follow  If discussed at Long Length of Stay Meetings, dates discussed:    Additional Comments:  Golda AcreDavis, Rhonda Lynn, RN 04/23/2016, 9:04 AM

## 2016-04-23 NOTE — Progress Notes (Signed)
Pharmacy Antibiotic Note  Vincent Coleman is a 71 y.o. male admitted on 04/22/2016 with UTI/sepsis.   Pharmacy was initially consulted for Cefepime/Vancomycin dosing, now narrowing spectrum and consulted to dose ceftriaxone. Given septic criteria on admit and weight > 100kg, will use higher dose at this time.  Plan: Ceftriaxone 2g IV q24h No further dose adjustments needed, Pharmacy will sign off; please reconsult if needed.  Height: 5\' 11"  (180.3 cm) Weight: 222 lb 0.1 oz (100.7 kg) IBW/kg (Calculated) : 75.3  Temp (24hrs), Avg:99.6 F (37.6 C), Min:98.6 F (37 C), Max:102.4 F (39.1 C)   Recent Labs Lab 04/22/16 1841 04/22/16 1842 04/22/16 2149 04/23/16 0401  WBC  --  3.8*  --  8.7  CREATININE  --  1.51*  --  1.26*  LATICACIDVEN 3.57*  --  1.73  --     Estimated Creatinine Clearance: 64.1 mL/min (by C-G formula based on SCr of 1.26 mg/dL (H)).    No Known Allergies  Antimicrobials this admission: 2/11 Cefepime >> 2/12 2/11 Vancomycin >>x1 ED 2/12 Rocephin >>   Dose adjustments this admission:   Microbiology results: 2/11 BCx: sent 2/11 UCx: sent 2/12 MRSA PCR: neg  Thank you for allowing pharmacy to be a part of this patient's care.  Loralee PacasErin Roquel Burgin, PharmD, BCPS Pager: [phone] 04/23/2016 10:51 AM

## 2016-04-23 NOTE — H&P (Signed)
History and Physical    Vincent Coleman AVW:[id number] DOB: [birth year] DOA: 04/22/2016   PCP: Vincent Chick, MD Chief Complaint:  Chief Complaint  Patient presents with  . Dysuria    HPI: Vincent Coleman is a 71 y.o. male with medical history significant of HTN, CKD.  Patient presents to the ED with c/o dysuria, fever, chills.  Symptoms began yesterday evening and night.  Symptoms persisted and worsened.  No hematuria, back pain, abd pain.  ED Course: Patient in septic shock in ED.  Initially hypotensive despite 3L IVF, levophed started, 4th L then 5th L given at instructions of PCCM and levophed was able to be titrated off for the moment.  Patient on cefepime also got 1 dose of vanc.  UA shows UTI.  Review of Systems: As per HPI otherwise 10 point review of systems negative.    Past Medical History:  Diagnosis Date  . CKD (chronic kidney disease)   . Emphysema of lung (HCC)   . History of throat cancer   . Hypertension     History reviewed. No pertinent surgical history.   reports that he has quit smoking. He has never used smokeless tobacco. He reports that he does not drink alcohol or use drugs.  No Known Allergies  History reviewed. No pertinent family history. No h/o kidney problems   Prior to Admission medications   Medication Sig Start Date End Date Taking? Authorizing Provider  levothyroxine (SYNTHROID, LEVOTHROID) 137 MCG tablet Take 137 mcg by mouth daily. 03/12/16  Yes Historical Provider, MD  lisinopril-hydrochlorothiazide (PRINZIDE,ZESTORETIC) 20-12.5 MG tablet Take 1 tablet by mouth daily. 02/14/16  Yes Historical Provider, MD  omeprazole (PRILOSEC) 20 MG capsule Take 20 mg by mouth 2 (two) times daily. 03/12/16  Yes Historical Provider, MD  Pseudoeph-Doxylamine-DM-APAP (NYQUIL PO) Take 30 mLs by mouth as needed (sleep, sore throat).   Yes Historical Provider, MD  rosuvastatin (CRESTOR) 40 MG tablet Take 40 mg by mouth daily. 03/12/16  Yes Historical Provider, MD    VENTOLIN HFA 108 (90 Base) MCG/ACT inhaler Take 2 puffs by mouth every 6 (six) hours. 01/16/16  Yes Historical Provider, MD    Physical Exam: Vitals:   04/23/16 0135 04/23/16 0145 04/23/16 0150 04/23/16 0155  BP: 130/83 141/92 141/81 136/87  Pulse: 110 111 113 112  Resp: 22 22 22 22   Temp:      TempSrc:      SpO2: 97% 97% 98% 98%  Weight:      Height:          Constitutional: NAD, calm, comfortable Eyes: PERRL, lids and conjunctivae normal ENMT: Mucous membranes are moist. Posterior pharynx clear of any exudate or lesions.Normal dentition.  Neck: normal, supple, no masses, no thyromegaly Respiratory: clear to auscultation bilaterally, no wheezing, no crackles. Normal respiratory effort. No accessory muscle use.  Cardiovascular: Regular rate and rhythm, no murmurs / rubs / gallops. No extremity edema. 2+ pedal pulses. No carotid bruits.  Abdomen: no tenderness, no masses palpated. No hepatosplenomegaly. Bowel sounds positive.  Musculoskeletal: no clubbing / cyanosis. No joint deformity upper and lower extremities. Good ROM, no contractures. Normal muscle tone.  Skin: no rashes, lesions, ulcers. No induration Neurologic: CN 2-12 grossly intact. Sensation intact, DTR normal. Strength 5/5 in all 4.  Psychiatric: Normal judgment and insight. Alert and oriented x 3. Normal mood.    Labs on Admission: I have personally reviewed following labs and imaging studies  CBC:  Recent Labs Lab 04/22/16 1842  WBC 3.8*  NEUTROABS  3.5  HGB 15.5  HCT 42.3  MCV 87.8  PLT 133*   Basic Metabolic Panel:  Recent Labs Lab 04/22/16 1842  NA 138  K 3.7  CL 105  CO2 20*  GLUCOSE 132*  BUN 30*  CREATININE 1.51*  CALCIUM 9.6   GFR: Estimated Creatinine Clearance: 52.1 mL/min (by C-G formula based on SCr of 1.51 mg/dL (H)). Liver Function Tests:  Recent Labs Lab 04/22/16 1842  AST 26  ALT 22  ALKPHOS 60  BILITOT 1.3*  PROT 7.6  ALBUMIN 4.2   No results for input(s): LIPASE,  AMYLASE in the last 168 hours. No results for input(s): AMMONIA in the last 168 hours. Coagulation Profile:  Recent Labs Lab 04/22/16 1842  INR 1.05   Cardiac Enzymes: No results for input(s): CKTOTAL, CKMB, CKMBINDEX, TROPONINI in the last 168 hours. BNP (last 3 results) No results for input(s): PROBNP in the last 8760 hours. HbA1C: No results for input(s): HGBA1C in the last 72 hours. CBG: No results for input(s): GLUCAP in the last 168 hours. Lipid Profile: No results for input(s): CHOL, HDL, LDLCALC, TRIG, CHOLHDL, LDLDIRECT in the last 72 hours. Thyroid Function Tests: No results for input(s): TSH, T4TOTAL, FREET4, T3FREE, THYROIDAB in the last 72 hours. Anemia Panel: No results for input(s): VITAMINB12, FOLATE, FERRITIN, TIBC, IRON, RETICCTPCT in the last 72 hours. Urine analysis:    Component Value Date/Time   COLORURINE YELLOW 04/22/2016 2339   APPEARANCEUR HAZY (A) 04/22/2016 2339   LABSPEC 1.023 04/22/2016 2339   PHURINE 5.0 04/22/2016 2339   GLUCOSEU NEGATIVE 04/22/2016 2339   HGBUR MODERATE (A) 04/22/2016 2339   BILIRUBINUR NEGATIVE 04/22/2016 2339   KETONESUR 5 (A) 04/22/2016 2339   PROTEINUR 30 (A) 04/22/2016 2339   NITRITE POSITIVE (A) 04/22/2016 2339   LEUKOCYTESUR LARGE (A) 04/22/2016 2339   Sepsis Labs: @LABRCNTIP (procalcitonin:4,lacticidven:4) )No results found for this or any previous visit (from the past 240 hour(s)).   Radiological Exams on Admission: Dg Chest 2 View  Result Date: 04/22/2016 CLINICAL DATA:  Fever for a few days. History of emphysema and hypertension. Evaluate possible sepsis. EXAM: CHEST  2 VIEW COMPARISON:  None. FINDINGS: Cardiomediastinal silhouette is normal. No pleural effusions or focal consolidations. LEFT hilar surgical clips. Trachea projects midline and there is no pneumothorax. Soft tissue planes and included osseous structures are non-suspicious. Old moderate approximate L1 compression fracture. Mild degenerative change  of thoracic spine. IMPRESSION: No acute cardiopulmonary process. Electronically Signed   By: Awilda Metroourtnay  Bloomer M.D.   On: 04/22/2016 19:11    EKG: Independently reviewed.  Assessment/Plan Principal Problem:   Septic shock (HCC) Active Problems:   CKD (chronic kidney disease)   Urinary tract infection    1. Septic shock from UTI - 1. Cefepime 2. Urine culture pending 3. IVF: 5L bolus in ED and 125 cc/hr, watch for fluid overload 4. Levophed is on hold at the moment but is still hanging and ready to be turned on if pressure drops more. 5. Repeat CBC, BMP in AM 2. CKD - Strict intake and output 1. Repeat BMP in AM   DVT prophylaxis: Lovenox Code Status: Full Family Communication: Daughter at bedside Consults called: EDP called PCCM who felt patient didn't need them at this time, see EDP note Admission status: Admit to inpatient   Hillary BowGARDNER, Morene Cecilio M. DO [facility] Pager [phone] from 7PM-7AM  If 7AM-7PM, please contact the day physician for the patient [email or web address] Password [email or web address]  04/23/2016, 1:58 AM

## 2016-04-23 NOTE — Progress Notes (Addendum)
PROGRESS NOTE  Vincent Coleman  JYN:[id number] DOB: 28-Oct-1945 DOA: 04/22/2016 PCP: Frederich Chick, MD  Brief Narrative:   The patient is a 71 yo male with history of COPD, HTN, and CKD stage 3 who presented to the ER with a 24 hour history of dysuria, urinary frequency, fever, and chills.  In the ER, he was hypotensive, lactic acid 3.57, and BP did not improve with 3L IVF so he was started on levophed and given 2 additional liters.  He was quickly titrated off the levophed.  He was started on cefepime for UTI which was suggested by history and UA.     Assessment & Plan:   Principal Problem:   Septic shock (HCC) Active Problems:   CKD (chronic kidney disease)   Urinary tract infection  Septic shock from UTI, present at time of admission, not catheter associated, sepsis physiology resolving.  BP has improved but tachycardia persists.  Lactic acidosis has resolved -  No indwelling catheter:  Change cefepime to ceftriaxone -  F/u urine culture -  Continue IVF -  D/c levophed -  Start pyridium x 1 day for dysuria -  Advance diet  Elevated troponin, likely due to septic shock -  Troponin 0.21 > 0.2 > 0.22 -  Check ECHO -  Continue aspirin, statin  CKD stage 3, creatinine trending down slightly with IVF to 1.26, baseline unknown -  Repeat BMP in AM  COPD, feeling a little SOB this morning- -  Start prn duonebs  Hypertension, blood pressures have risen  -  Start prn hydralazine -  Resume lisinopril -  Continue to hold HCTZ -  Treat pain  DVT prophylaxis:  lovenox Code Status:  Full  Family Communication:  Patient alone, no family at bedside Disposition Plan:  Likely home in a few days.     Consultants:   none  Procedures:  none  Antimicrobials:  Anti-infectives    Start     Dose/Rate Route Frequency Ordered Stop   04/23/16 1000  vancomycin (VANCOCIN) IVPB 750 mg/150 ml premix  Status:  Discontinued     750 mg 150 mL/hr over 60 Minutes Intravenous Every 12 hours  04/22/16 2343 04/23/16 0138   04/23/16 0200  ceFEPIme (MAXIPIME) 1 g in dextrose 5 % 50 mL IVPB     1 g 100 mL/hr over 30 Minutes Intravenous Every 8 hours 04/22/16 2022     04/22/16 2245  vancomycin (VANCOCIN) IVPB 1000 mg/200 mL premix     1,000 mg 200 mL/hr over 60 Minutes Intravenous STAT 04/22/16 2244 04/22/16 2354   04/22/16 1845  ceFEPIme (MAXIPIME) 2 g in dextrose 5 % 50 mL IVPB     2 g 100 mL/hr over 30 Minutes Intravenous  Once 04/22/16 1837 04/22/16 1937       Subjective: Feeling better this morning.  Dysuria improved but still present.  A little SOB this morning but does not feel like he needs a breathing treatment.  Uses an inhaler at home.    Objective: Vitals:   04/23/16 0600 04/23/16 0700 04/23/16 0800 04/23/16 0900  BP: (!) 195/153 (!) 190/78 (!) 170/87 (!) 164/74  Pulse:      Resp: (!) 26 (!) 30 (!) 23 (!) 25  Temp:   98.8 F (37.1 C)   TempSrc:   Oral   SpO2: 96% 96% 96% 96%  Weight:      Height:        Intake/Output Summary (Last 24 hours) at 04/23/16 1033 Last  data filed at 04/23/16 0909  Gross per 24 hour  Intake          1262.92 ml  Output              852 ml  Net           410.92 ml   Filed Weights   04/22/16 1827 04/22/16 2003 04/23/16 0230  Weight: 96.2 kg (212 lb) 95.3 kg (210 lb) 100.7 kg (222 lb 0.1 oz)    Examination:  General exam:  Adult male.  No acute distress.  HEENT:  NCAT, MMM Respiratory system: diminished bilateral breath sounds, no focal rales, wheeze, or rhonchi Cardiovascular system: Regular rate and rhythm, normal S1/S2. No murmurs, rubs, gallops or clicks.  Warm extremities Gastrointestinal system: Normal active bowel sounds, soft, nondistended, mild TTP in the suprapubic pain MSK:  Normal tone and bulk, no lower extremity edema Neuro:  Grossly intact    Data Reviewed: I have personally reviewed following labs and imaging studies  CBC:  Recent Labs Lab 04/22/16 1842 04/23/16 0401  WBC 3.8* 8.7  NEUTROABS 3.5   --   HGB 15.5 13.3  HCT 42.3 38.1*  MCV 87.8 89.0  PLT 133* 129*   Basic Metabolic Panel:  Recent Labs Lab 04/22/16 1842 04/23/16 0401  NA 138 138  K 3.7 4.7  CL 105 109  CO2 20* 22  GLUCOSE 132* 120*  BUN 30* 22*  CREATININE 1.51* 1.26*  CALCIUM 9.6 8.1*   GFR: Estimated Creatinine Clearance: 64.1 mL/min (by C-G formula based on SCr of 1.26 mg/dL (H)). Liver Function Tests:  Recent Labs Lab 04/22/16 1842  AST 26  ALT 22  ALKPHOS 60  BILITOT 1.3*  PROT 7.6  ALBUMIN 4.2   No results for input(s): LIPASE, AMYLASE in the last 168 hours. No results for input(s): AMMONIA in the last 168 hours. Coagulation Profile:  Recent Labs Lab 04/22/16 1842  INR 1.05   Cardiac Enzymes:  Recent Labs Lab 04/23/16 0401 04/23/16 0926  TROPONINI 0.20* 0.22*   BNP (last 3 results) No results for input(s): PROBNP in the last 8760 hours. HbA1C: No results for input(s): HGBA1C in the last 72 hours. CBG: No results for input(s): GLUCAP in the last 168 hours. Lipid Profile: No results for input(s): CHOL, HDL, LDLCALC, TRIG, CHOLHDL, LDLDIRECT in the last 72 hours. Thyroid Function Tests: No results for input(s): TSH, T4TOTAL, FREET4, T3FREE, THYROIDAB in the last 72 hours. Anemia Panel: No results for input(s): VITAMINB12, FOLATE, FERRITIN, TIBC, IRON, RETICCTPCT in the last 72 hours. Urine analysis:    Component Value Date/Time   COLORURINE YELLOW 04/22/2016 2339   APPEARANCEUR HAZY (A) 04/22/2016 2339   LABSPEC 1.023 04/22/2016 2339   PHURINE 5.0 04/22/2016 2339   GLUCOSEU NEGATIVE 04/22/2016 2339   HGBUR MODERATE (A) 04/22/2016 2339   BILIRUBINUR NEGATIVE 04/22/2016 2339   KETONESUR 5 (A) 04/22/2016 2339   PROTEINUR 30 (A) 04/22/2016 2339   NITRITE POSITIVE (A) 04/22/2016 2339   LEUKOCYTESUR LARGE (A) 04/22/2016 2339   Sepsis Labs: @LABRCNTIP (procalcitonin:4,lacticidven:4)  ) Recent Results (from the past 240 hour(s))  MRSA PCR Screening     Status: None    Collection Time: 04/23/16  2:11 AM  Result Value Ref Range Status   MRSA by PCR NEGATIVE NEGATIVE Final    Comment:        The GeneXpert MRSA Assay (FDA approved for NASAL specimens only), is one component of a comprehensive MRSA colonization surveillance program. It is not intended  to diagnose MRSA infection nor to guide or monitor treatment for MRSA infections.       Radiology Studies: Dg Chest 2 View  Result Date: 04/22/2016 CLINICAL DATA:  Fever for a few days. History of emphysema and hypertension. Evaluate possible sepsis. EXAM: CHEST  2 VIEW COMPARISON:  None. FINDINGS: Cardiomediastinal silhouette is normal. No pleural effusions or focal consolidations. LEFT hilar surgical clips. Trachea projects midline and there is no pneumothorax. Soft tissue planes and included osseous structures are non-suspicious. Old moderate approximate L1 compression fracture. Mild degenerative change of thoracic spine. IMPRESSION: No acute cardiopulmonary process. Electronically Signed   By: Awilda Metroourtnay  Bloomer M.D.   On: 04/22/2016 19:11     Scheduled Meds: . [START ON 04/24/2016] aspirin EC  81 mg Oral Daily  . ceFEPime (MAXIPIME) IV  1 g Intravenous Q8H  . enoxaparin (LOVENOX) injection  40 mg Subcutaneous Q24H  . levothyroxine  137 mcg Oral QAC breakfast  . pantoprazole  40 mg Oral Daily  . phenazopyridine  100 mg Oral TID WC  . rosuvastatin  40 mg Oral q1800   Continuous Infusions: . sodium chloride 75 mL/hr at 04/23/16 0753     LOS: 0 days    Time spent: 30 min    Renae FickleSHORT, Deuntae Kocsis, MD [facility] Pager [phone]  If 7PM-7AM, please contact night-coverage [email or web address] Password [email or web address] 04/23/2016, 10:33 AM

## 2016-04-23 NOTE — Progress Notes (Signed)
PHARMACY - PHYSICIAN COMMUNICATION CRITICAL VALUE ALERT - BLOOD CULTURE IDENTIFICATION (BCID)  Results for orders placed or performed during the hospital encounter of 04/22/16  Blood Culture ID Panel (Reflexed) (Collected: 04/22/2016  6:34 PM)  Result Value Ref Range   Enterococcus species NOT DETECTED NOT DETECTED   Listeria monocytogenes NOT DETECTED NOT DETECTED   Staphylococcus species NOT DETECTED NOT DETECTED   Staphylococcus aureus NOT DETECTED NOT DETECTED   Streptococcus species NOT DETECTED NOT DETECTED   Streptococcus agalactiae NOT DETECTED NOT DETECTED   Streptococcus pneumoniae NOT DETECTED NOT DETECTED   Streptococcus pyogenes NOT DETECTED NOT DETECTED   Acinetobacter baumannii NOT DETECTED NOT DETECTED   Enterobacteriaceae species DETECTED (A) NOT DETECTED   Enterobacter cloacae complex NOT DETECTED NOT DETECTED   Escherichia coli DETECTED (A) NOT DETECTED   Klebsiella oxytoca NOT DETECTED NOT DETECTED   Klebsiella pneumoniae NOT DETECTED NOT DETECTED   Proteus species NOT DETECTED NOT DETECTED   Serratia marcescens NOT DETECTED NOT DETECTED   Carbapenem resistance NOT DETECTED NOT DETECTED   Haemophilus influenzae NOT DETECTED NOT DETECTED   Neisseria meningitidis NOT DETECTED NOT DETECTED   Pseudomonas aeruginosa NOT DETECTED NOT DETECTED   Candida albicans NOT DETECTED NOT DETECTED   Candida glabrata NOT DETECTED NOT DETECTED   Candida krusei NOT DETECTED NOT DETECTED   Candida parapsilosis NOT DETECTED NOT DETECTED   Candida tropicalis NOT DETECTED NOT DETECTED    Name of physician (or Provider) Contacted: Dr. Malachi BondsShort  Changes to prescribed antibiotics required: No changes, continue ceftriaxone  Dannielle HuhZeigler, Maely Clements George 04/23/2016  1:53 PM

## 2016-04-23 NOTE — Progress Notes (Signed)
Echocardiogram 2D Echocardiogram has been performed.  Vincent Coleman 04/23/2016, 12:12 PM

## 2016-04-23 NOTE — Progress Notes (Signed)
Per pt. Daughter, reported to this RN that pt. Has had painful urination. When assessed and asked pt. If he was in pain, he stated no. Pt. Also requested medicine to help him sleep tonight. On call NP Schorr paged about both issues. Will continue to monitor pt. And carry out any new orders.

## 2016-04-24 DIAGNOSIS — R778 Other specified abnormalities of plasma proteins: Secondary | ICD-10-CM

## 2016-04-24 DIAGNOSIS — D649 Anemia, unspecified: Secondary | ICD-10-CM

## 2016-04-24 DIAGNOSIS — R7989 Other specified abnormal findings of blood chemistry: Secondary | ICD-10-CM

## 2016-04-24 DIAGNOSIS — B9620 Unspecified Escherichia coli [E. coli] as the cause of diseases classified elsewhere: Secondary | ICD-10-CM

## 2016-04-24 DIAGNOSIS — D696 Thrombocytopenia, unspecified: Secondary | ICD-10-CM

## 2016-04-24 DIAGNOSIS — R7881 Bacteremia: Secondary | ICD-10-CM

## 2016-04-24 DIAGNOSIS — N3001 Acute cystitis with hematuria: Secondary | ICD-10-CM

## 2016-04-24 LAB — COMPREHENSIVE METABOLIC PANEL
ALT: 18 U/L (ref 17–63)
ANION GAP: 7 (ref 5–15)
AST: 18 U/L (ref 15–41)
Albumin: 3 g/dL — ABNORMAL LOW (ref 3.5–5.0)
Alkaline Phosphatase: 40 U/L (ref 38–126)
BUN: 19 mg/dL (ref 6–20)
CALCIUM: 7.7 mg/dL — AB (ref 8.9–10.3)
CHLORIDE: 108 mmol/L (ref 101–111)
CO2: 20 mmol/L — AB (ref 22–32)
Creatinine, Ser: 1.03 mg/dL (ref 0.61–1.24)
GFR calc non Af Amer: >60 mL/min (ref >60)
Glucose, Bld: 101 mg/dL — ABNORMAL HIGH (ref 65–99)
Potassium: 3.5 mmol/L (ref 3.5–5.1)
SODIUM: 135 mmol/L (ref 135–145)
Total Bilirubin: 1.1 mg/dL (ref 0.3–1.2)
Total Protein: 5.8 g/dL — ABNORMAL LOW (ref 6.5–8.1)

## 2016-04-24 LAB — CBC
HCT: 33.7 % — ABNORMAL LOW (ref 39.0–52.0)
Hemoglobin: 12.1 g/dL — ABNORMAL LOW (ref 13.0–17.0)
MCH: 31.4 pg (ref 26.0–34.0)
MCHC: 35.9 g/dL (ref 30.0–36.0)
MCV: 87.5 fL (ref 78.0–100.0)
PLATELETS: 104 10*3/uL — AB (ref 150–400)
RBC: 3.85 MIL/uL — ABNORMAL LOW (ref 4.22–5.81)
RDW: 13 % (ref 11.5–15.5)
WBC: 6.5 10*3/uL (ref 4.0–10.5)

## 2016-04-24 LAB — URINE CULTURE
Culture: <10000 — AB
SPECIAL REQUESTS: NORMAL

## 2016-04-24 MED ORDER — HYDROCHLOROTHIAZIDE 12.5 MG PO CAPS
12.5000 mg | ORAL_CAPSULE | Freq: Every day | ORAL | Status: DC
  Administered 2016-04-25: 12.5 mg via ORAL
  Filled 2016-04-24: qty 1

## 2016-04-24 NOTE — Progress Notes (Addendum)
PROGRESS NOTE  Vincent Coleman  WUJ:[id number] DOB: [birth year] DOA: 04/22/2016 PCP: Frederich Chick, MD  Brief Narrative:   The patient is a 71 yo male with history of COPD, HTN, and CKD stage 3 who presented to the ER with a 24 hour history of dysuria, urinary frequency, fever, and chills.  In the ER, he was hypotensive, lactic acid 3.57, and BP did not improve with 3L IVF so he was started on levophed and given 2 additional liters.  He was quickly titrated off the levophed.  He was started on cefepime by the ER for UTI which was suggested by history and UA.  He was transitioned to ceftriaxone.  Blood culture is growing E. Coli, sensitivities are pending.  Repeat blood culture is pending.  Anticipate he will need 7-10 days of antibiotics for his sepsis, bacteremia, UTI.   Assessment & Plan:   Principal Problem:   Septic shock (HCC) Active Problems:   CKD (chronic kidney disease)   Urinary tract infection with hematuria   E coli bacteremia   Thrombocytopenia (HCC)   Normocytic anemia   Elevated troponin  Septic shock from UTI (hypotension and temporarily on levophed in ER, tachycardia, tachypnea, fever), present at time of admission, not catheter associated, sepsis physiology resolved.  Lactic acidosis has resolved -  Continue ceftriaxone -  Urine culture < 10 000 colonies, but having dysuria/frequency.  Suspect antibiotics were given prior to UA being obtained. -  D/c IVF -  F/u sensitivities of initial blood cultures -  F/u repeat blood culture  Elevated troponin, likely due to septic shock -  Troponin 0.21 > 0.2 > 0.22 -  ECHO:  Preserved EF, grade 1 DD, no wall motion abnormalities, no valvular dysfunction -  Continue aspirin, statin  CKD stage 3, creatinine trended down from 1.51 to 1.03 -  Repeat BMP in AM  COPD, stable  -  Continue prn duonebs  Hypertension, blood pressures improved -  Continue lisinopril -  Resume HCTZ  Mild anemia and thrombocytopenia likely  hemodilutional and due to acute illness/marrow suppression -  CBC in AM  DVT prophylaxis:  lovenox Code Status:  Full  Family Communication:  Patient.  Daughter and two granddaughters at bedside Disposition Plan:  Likely home tomorrow.  Will ambulate in halls today.  Culture sensitivities   Consultants:   none  Procedures:  none  Antimicrobials:  Anti-infectives    Start     Dose/Rate Route Frequency Ordered Stop   04/23/16 1600  cefTRIAXone (ROCEPHIN) 2 g in dextrose 5 % 50 mL IVPB     2 g 100 mL/hr over 30 Minutes Intravenous Every 24 hours 04/23/16 1054     04/23/16 1000  vancomycin (VANCOCIN) IVPB 750 mg/150 ml premix  Status:  Discontinued     750 mg 150 mL/hr over 60 Minutes Intravenous Every 12 hours 04/22/16 2343 04/23/16 0138   04/23/16 0200  ceFEPIme (MAXIPIME) 1 g in dextrose 5 % 50 mL IVPB  Status:  Discontinued     1 g 100 mL/hr over 30 Minutes Intravenous Every 8 hours 04/22/16 2022 04/23/16 1046   04/22/16 2245  vancomycin (VANCOCIN) IVPB 1000 mg/200 mL premix     1,000 mg 200 mL/hr over 60 Minutes Intravenous STAT 04/22/16 2244 04/22/16 2354   04/22/16 1845  ceFEPIme (MAXIPIME) 2 g in dextrose 5 % 50 mL IVPB     2 g 100 mL/hr over 30 Minutes Intravenous  Once 04/22/16 1837 04/22/16 1937  Subjective: Feeling well.  Required additional pain medication overnight for dysuria, but overall improving.  Eating better.   Has not been out of bed since admission.      Objective: Vitals:   04/23/16 1348 04/23/16 2230 04/24/16 0419 04/24/16 1459  BP: 131/69 (!) 148/76 128/61 136/84  Pulse:  97 92 84  Resp: 18 18 18 19   Temp: 98.1 F (36.7 C) 98.5 F (36.9 C) 98.8 F (37.1 C) 98.1 F (36.7 C)  TempSrc: Oral Oral Oral Oral  SpO2: 96% 97% 96% 98%  Weight:      Height:        Intake/Output Summary (Last 24 hours) at 04/24/16 1625 Last data filed at 04/24/16 1500  Gross per 24 hour  Intake             2315 ml  Output              975 ml  Net              1340 ml   Filed Weights   04/22/16 1827 04/22/16 2003 04/23/16 0230  Weight: 96.2 kg (212 lb) 95.3 kg (210 lb) 100.7 kg (222 lb 0.1 oz)    Examination:  General exam:  Adult male.  No acute distress.  HEENT:  NCAT, MMM Respiratory system: Diminished bilateral breath sounds, no focal rales, wheeze, or rhonchi Cardiovascular system: Regular rate and rhythm, normal S1/S2. No murmurs, rubs, gallops or clicks.  Warm extremities Gastrointestinal system: Normal active bowel sounds, soft, nondistended, decreased suprapubic pain MSK:  Normal tone and bulk, no lower extremity edema Neuro:  Grossly intact    Data Reviewed: I have personally reviewed following labs and imaging studies  CBC:  Recent Labs Lab 04/22/16 1842 04/23/16 0401 04/24/16 0529  WBC 3.8* 8.7 6.5  NEUTROABS 3.5  --   --   HGB 15.5 13.3 12.1*  HCT 42.3 38.1* 33.7*  MCV 87.8 89.0 87.5  PLT 133* 129* 104*   Basic Metabolic Panel:  Recent Labs Lab 04/22/16 1842 04/23/16 0401 04/24/16 0529  NA 138 138 135  K 3.7 4.7 3.5  CL 105 109 108  CO2 20* 22 20*  GLUCOSE 132* 120* 101*  BUN 30* 22* 19  CREATININE 1.51* 1.26* 1.03  CALCIUM 9.6 8.1* 7.7*   GFR: Estimated Creatinine Clearance: 78.4 mL/min (by C-G formula based on SCr of 1.03 mg/dL). Liver Function Tests:  Recent Labs Lab 04/22/16 1842 04/24/16 0529  AST 26 18  ALT 22 18  ALKPHOS 60 40  BILITOT 1.3* 1.1  PROT 7.6 5.8*  ALBUMIN 4.2 3.0*   No results for input(s): LIPASE, AMYLASE in the last 168 hours. No results for input(s): AMMONIA in the last 168 hours. Coagulation Profile:  Recent Labs Lab 04/22/16 1842  INR 1.05   Cardiac Enzymes:  Recent Labs Lab 04/23/16 0401 04/23/16 0926 04/23/16 1545  TROPONINI 0.20* 0.22* 0.19*   BNP (last 3 results) No results for input(s): PROBNP in the last 8760 hours. HbA1C: No results for input(s): HGBA1C in the last 72 hours. CBG: No results for input(s): GLUCAP in the last 168  hours. Lipid Profile: No results for input(s): CHOL, HDL, LDLCALC, TRIG, CHOLHDL, LDLDIRECT in the last 72 hours. Thyroid Function Tests: No results for input(s): TSH, T4TOTAL, FREET4, T3FREE, THYROIDAB in the last 72 hours. Anemia Panel: No results for input(s): VITAMINB12, FOLATE, FERRITIN, TIBC, IRON, RETICCTPCT in the last 72 hours. Urine analysis:    Component Value Date/Time   COLORURINE YELLOW  04/22/2016 2339   APPEARANCEUR HAZY (A) 04/22/2016 2339   LABSPEC 1.023 04/22/2016 2339   PHURINE 5.0 04/22/2016 2339   GLUCOSEU NEGATIVE 04/22/2016 2339   HGBUR MODERATE (A) 04/22/2016 2339   BILIRUBINUR NEGATIVE 04/22/2016 2339   KETONESUR 5 (A) 04/22/2016 2339   PROTEINUR 30 (A) 04/22/2016 2339   NITRITE POSITIVE (A) 04/22/2016 2339   LEUKOCYTESUR LARGE (A) 04/22/2016 2339   Sepsis Labs: @LABRCNTIP (procalcitonin:4,lacticidven:4)  ) Recent Results (from the past 240 hour(s))  Culture, blood (Routine x 2)     Status: Abnormal (Preliminary result)   Collection Time: 04/22/16  6:34 PM  Result Value Ref Range Status   Specimen Description BLOOD BLOOD RIGHT FOREARM  Final   Special Requests BOTTLES DRAWN AEROBIC AND ANAEROBIC 5CC  Final   Culture  Setup Time   Final    GRAM NEGATIVE RODS IN BOTH AEROBIC AND ANAEROBIC BOTTLES Organism ID to follow CRITICAL RESULT CALLED TO, READ BACK BY AND VERIFIED WITH: T. Green Pharm.D. 13:35 04/23/16 ([email or web address])    Culture (A)  Final    ESCHERICHIA COLI SUSCEPTIBILITIES TO FOLLOW Performed at [facility], [address] [place], [place] [address]    Report Status PENDING  Incomplete  Blood Culture ID Panel (Reflexed)     Status: Abnormal   Collection Time: 04/22/16  6:34 PM  Result Value Ref Range Status   Enterococcus species NOT DETECTED NOT DETECTED Final   Listeria monocytogenes NOT DETECTED NOT DETECTED Final   Staphylococcus species NOT DETECTED NOT DETECTED Final   Staphylococcus aureus NOT DETECTED NOT DETECTED Final    Streptococcus species NOT DETECTED NOT DETECTED Final   Streptococcus agalactiae NOT DETECTED NOT DETECTED Final   Streptococcus pneumoniae NOT DETECTED NOT DETECTED Final   Streptococcus pyogenes NOT DETECTED NOT DETECTED Final   Acinetobacter baumannii NOT DETECTED NOT DETECTED Final   Enterobacteriaceae species DETECTED (A) NOT DETECTED Final    Comment: Enterobacteriaceae represent a large family of gram-negative bacteria, not a single organism. CRITICAL RESULT CALLED TO, READ BACK BY AND VERIFIED WITH: T. Green Pharm.D. 13:35 04/23/16 ([email or web address])    Enterobacter cloacae complex NOT DETECTED NOT DETECTED Final   Escherichia coli DETECTED (A) NOT DETECTED Final    Comment: CRITICAL RESULT CALLED TO, READ BACK BY AND VERIFIED WITH: T. Green Pharm.D. 13:35 04/23/16 ([email or web address])    Klebsiella oxytoca NOT DETECTED NOT DETECTED Final   Klebsiella pneumoniae NOT DETECTED NOT DETECTED Final   Proteus species NOT DETECTED NOT DETECTED Final   Serratia marcescens NOT DETECTED NOT DETECTED Final   Carbapenem resistance NOT DETECTED NOT DETECTED Final   Haemophilus influenzae NOT DETECTED NOT DETECTED Final   Neisseria meningitidis NOT DETECTED NOT DETECTED Final   Pseudomonas aeruginosa NOT DETECTED NOT DETECTED Final   Candida albicans NOT DETECTED NOT DETECTED Final   Candida glabrata NOT DETECTED NOT DETECTED Final   Candida krusei NOT DETECTED NOT DETECTED Final   Candida parapsilosis NOT DETECTED NOT DETECTED Final   Candida tropicalis NOT DETECTED NOT DETECTED Final    Comment: Performed at [facility], [address], [place], [place] [address]  Culture, blood (Routine x 2)     Status: None (Preliminary result)   Collection Time: 04/22/16  6:43 PM  Result Value Ref Range Status   Specimen Description BLOOD RIGHT WRIST  Final   Special Requests BOTTLES DRAWN AEROBIC AND ANAEROBIC 5CC  Final   Culture  Setup Time   Final    GRAM NEGATIVE RODS IN BOTH AEROBIC  AND ANAEROBIC  BOTTLES CRITICAL VALUE NOTED.  VALUE IS CONSISTENT WITH PREVIOUSLY REPORTED AND CALLED VALUE.    Culture   Final    GRAM NEGATIVE RODS IDENTIFICATION AND SUSCEPTIBILITIES TO FOLLOW Performed at [facility], [address], [place], [place] [address]    Report Status PENDING  Incomplete  Urine culture     Status: Abnormal   Collection Time: 04/22/16 11:39 PM  Result Value Ref Range Status   Specimen Description URINE, CLEAN CATCH  Final   Special Requests Normal  Final   Culture (A)  Final    <10,000 COLONIES/mL INSIGNIFICANT GROWTH Performed at [facility], [address], [place], [place] [address]    Report Status 04/24/2016 FINAL  Final  MRSA PCR Screening     Status: None   Collection Time: 04/23/16  2:11 AM  Result Value Ref Range Status   MRSA by PCR NEGATIVE NEGATIVE Final    Comment:        The GeneXpert MRSA Assay (FDA approved for NASAL specimens only), is one component of a comprehensive MRSA colonization surveillance program. It is not intended to diagnose MRSA infection nor to guide or monitor treatment for MRSA infections.   Culture, blood (single) w Reflex to ID Panel     Status: None (Preliminary result)   Collection Time: 04/23/16  2:18 PM  Result Value Ref Range Status   Specimen Description BLOOD LEFT HAND  Final   Special Requests IN PEDIATRIC BOTTLE 4 CC  Final   Culture   Final    NO GROWTH < 24 HOURS Performed at [facility], [address] [place], [place] [address]    Report Status PENDING  Incomplete      Radiology Studies: Dg Chest 2 View  Result Date: 04/22/2016 CLINICAL DATA:  Fever for a few days. History of emphysema and hypertension. Evaluate possible sepsis. EXAM: CHEST  2 VIEW COMPARISON:  None. FINDINGS: Cardiomediastinal silhouette is normal. No pleural effusions or focal consolidations. LEFT hilar surgical clips. Trachea projects midline and there is no pneumothorax. Soft tissue planes and included osseous  structures are non-suspicious. Old moderate approximate L1 compression fracture. Mild degenerative change of thoracic spine. IMPRESSION: No acute cardiopulmonary process. Electronically Signed   By: Awilda Metro M.D.   On: 04/22/2016 19:11     Scheduled Meds: . aspirin EC  81 mg Oral Daily  . cefTRIAXone (ROCEPHIN)  IV  2 g Intravenous Q24H  . enoxaparin (LOVENOX) injection  40 mg Subcutaneous Q24H  . [START ON 04/25/2016] hydrochlorothiazide  12.5 mg Oral Daily  . levothyroxine  137 mcg Oral QAC breakfast  . lisinopril  20 mg Oral Daily  . pantoprazole  40 mg Oral Daily  . rosuvastatin  40 mg Oral q1800   Continuous Infusions:    LOS: 1 day    Time spent: 30 min    Renae Fickle, MD [facility] Pager [phone]  If 7PM-7AM, please contact night-coverage [email or web address] Password [facility] 04/24/2016, 4:25 PM

## 2016-04-24 NOTE — Care Management Note (Signed)
Case Management Note  Patient Details  Name: Karna ChristmasRichard Claire MRN: [id number] Date of Birth: [birth year]  Subjective/Objective: 71 y/o m admitted w/Septic shock. From home.                   Action/Plan:d/c plan home.   Expected Discharge Date:   (unkniwn)               Expected Discharge Plan:     In-House Referral:     Discharge planning Services  CM Consult  Post Acute Care Choice:    Choice offered to:     DME Arranged:    DME Agency:     HH Arranged:    HH Agency:     Status of Service:  In process, will continue to follow  If discussed at Long Length of Stay Meetings, dates discussed:    Additional Comments:  Lanier ClamMahabir, Damoney Julia, RN 04/24/2016, 1:19 PM

## 2016-04-25 DIAGNOSIS — R6521 Severe sepsis with septic shock: Secondary | ICD-10-CM

## 2016-04-25 DIAGNOSIS — A419 Sepsis, unspecified organism: Principal | ICD-10-CM

## 2016-04-25 LAB — BASIC METABOLIC PANEL
Anion gap: 6 (ref 5–15)
BUN: 16 mg/dL (ref 6–20)
CHLORIDE: 108 mmol/L (ref 101–111)
CO2: 22 mmol/L (ref 22–32)
Calcium: 8.4 mg/dL — ABNORMAL LOW (ref 8.9–10.3)
Creatinine, Ser: 0.88 mg/dL (ref 0.61–1.24)
GFR calc Af Amer: >60 mL/min (ref >60)
GFR calc non Af Amer: >60 mL/min (ref >60)
GLUCOSE: 100 mg/dL — AB (ref 65–99)
POTASSIUM: 3.6 mmol/L (ref 3.5–5.1)
Sodium: 136 mmol/L (ref 135–145)

## 2016-04-25 LAB — CULTURE, BLOOD (ROUTINE X 2)

## 2016-04-25 LAB — CBC
HCT: 35 % — ABNORMAL LOW (ref 39.0–52.0)
HEMOGLOBIN: 12.5 g/dL — AB (ref 13.0–17.0)
MCH: 31 pg (ref 26.0–34.0)
MCHC: 35.7 g/dL (ref 30.0–36.0)
MCV: 86.8 fL (ref 78.0–100.0)
Platelets: 110 10*3/uL — ABNORMAL LOW (ref 150–400)
RBC: 4.03 MIL/uL — ABNORMAL LOW (ref 4.22–5.81)
RDW: 12.6 % (ref 11.5–15.5)
WBC: 4.8 10*3/uL (ref 4.0–10.5)

## 2016-04-25 MED ORDER — TAMSULOSIN HCL 0.4 MG PO CAPS: 0.4000 mg | ORAL_CAPSULE | Freq: Every day | ORAL | 2 refills | Status: DC

## 2016-04-25 MED ORDER — CEFPODOXIME PROXETIL 200 MG PO TABS
200.0000 mg | ORAL_TABLET | Freq: Two times a day (BID) | ORAL | Status: DC
  Administered 2016-04-25: 200 mg via ORAL
  Filled 2016-04-25: qty 1

## 2016-04-25 MED ORDER — CEFPODOXIME PROXETIL 200 MG PO TABS: 200.0000 mg | ORAL_TABLET | Freq: Two times a day (BID) | ORAL | 0 refills | Status: DC

## 2016-04-25 MED ORDER — ASPIRIN 81 MG PO TBEC: 81.0000 mg | DELAYED_RELEASE_TABLET | Freq: Every day | ORAL | Status: DC

## 2016-04-25 MED ORDER — URELLE 81 MG PO TABS: 1.0000 | ORAL_TABLET | Freq: Three times a day (TID) | ORAL | 0 refills | Status: DC | PRN

## 2016-04-25 MED ORDER — HYDROCHLOROTHIAZIDE 12.5 MG PO CAPS: 12.5000 mg | ORAL_CAPSULE | Freq: Every day | ORAL | 1 refills | Status: DC

## 2016-04-25 NOTE — Discharge Summary (Signed)
Physician Discharge Summary  Vincent Coleman ZOX:[id number] DOB: 1945-12-16 DOA: 04/22/2016  PCP: Frederich Chick, MD  Admit date: 04/22/2016 Discharge date: 04/25/2016  Time spent: 35 minutes  Recommendations for Outpatient Follow-up:  1. Complete Abx as mentioned 2. Need labs 3-4 weeks 3. Follow as OP  Discharge Diagnoses:  Principal Problem:   Septic shock (HCC) Active Problems:   CKD (chronic kidney disease)   Urinary tract infection with hematuria   E coli bacteremia   Thrombocytopenia (HCC)   Normocytic anemia   Elevated troponin   Discharge Condition: fair  Diet recommendation: hh low salt  Filed Weights   04/22/16 1827 04/22/16 2003 04/23/16 0230  Weight: 96.2 kg (212 lb) 95.3 kg (210 lb) 100.7 kg (222 lb 0.1 oz)    History of present illness:   71 yo ? COPD,  HTN,  CKD stage 3 who presented to the ER  04/23/16  24 hour history of dysuria, urinary frequency, fever, and chills.   In the ER, he was hypotensive, lactic acid 3.57, and BP did not improve with 3L IVF so he was started on levophed and given 2 additional liters.   He was quickly titrated off the levophed.  H e was started on cefepime by the ER for UTI which was suggested by history and UA.   He was transitioned to ceftriaxone.   Blood culture is growing E. Coli, sensitivities are pending.   Repeat blood culture is pending.  Anticipate he will need 7-10 days of antibiotics for his sepsis, bacteremia, UTI.    Hospital Course:  Septic shock from UTI (hypotension and temporarily on levophed in ER, tachycardia, tachypnea, fever), present at time of admission, not catheter associated, sepsis physiology resolved.  Lactic acidosis has resolved - Continue ceftriaxone - Urine culture < 10 000 colonies, but having dysuria/frequency.  Suspect antibiotics were given prior to UA being obtained. - D/c IVF - F/u sensitivities of initial blood cultures showed E.coli - transitioned to PO Vantin q12 x 5 more days  equalling 7 total days therapy  Elevated troponin, likely due to septic shock -  Troponin 0.21 > 0.2 > 0.22 -  ECHO:  Preserved EF, grade 1 DD, no wall motion abnormalities, no valvular dysfunction -  Continue aspirin, statin  CKD stage 3, creatinine trended down from 30/1.51 to 1.03 -  stabilized on d/c  COPD, stable  -  Continue prn duonebs  Hypertension, blood pressures improved -  Continue lisinopril -  Resume HCTZ  Mild anemia and thrombocytopenia likely hemodilutional and due to acute illness/marrow suppression -  CBC in AM    Discharge Exam: Vitals:   04/25/16 0741 04/25/16 1332  BP: (!) 148/79 (!) 163/91  Pulse:  81  Resp:  19  Temp:  99.5 F (37.5 C)    General: alert oriented in nad Cardiovascular: s1 s2 no m/r/g Respiratory: clear no added sound  Discharge Instructions   Discharge Instructions    Diet - low sodium heart healthy    Complete by:  As directed    Discharge instructions    Complete by:  As directed    Complete all anti-biotics for infection Follow up with primary Md in about 2-3 weeks and get follow up labs   Increase activity slowly    Complete by:  As directed      Current Discharge Medication List    START taking these medications   Details  aspirin EC 81 MG EC tablet Take 1 tablet (81 mg total) by  mouth daily.    cefpodoxime (VANTIN) 200 MG tablet Take 1 tablet (200 mg total) by mouth every 12 (twelve) hours. Qty: 10 tablet, Refills: 0    hydrochlorothiazide (MICROZIDE) 12.5 MG capsule Take 1 capsule (12.5 mg total) by mouth daily. Qty: 30 capsule, Refills: 1    tamsulosin (FLOMAX) 0.4 MG CAPS capsule Take 1 capsule (0.4 mg total) by mouth daily after breakfast. Qty: 30 capsule, Refills: 2    URELLE (URELLE/URISED) 81 MG TABS tablet Take 1 tablet (81 mg total) by mouth 3 (three) times daily as needed for bladder spasms. Qty: 120 each, Refills: 0      CONTINUE these medications which have NOT CHANGED   Details   levothyroxine (SYNTHROID, LEVOTHROID) 137 MCG tablet Take 137 mcg by mouth daily.    lisinopril-hydrochlorothiazide (PRINZIDE,ZESTORETIC) 20-12.5 MG tablet Take 1 tablet by mouth daily.    omeprazole (PRILOSEC) 20 MG capsule Take 20 mg by mouth 2 (two) times daily.    Pseudoeph-Doxylamine-DM-APAP (NYQUIL PO) Take 30 mLs by mouth as needed (sleep, sore throat).    rosuvastatin (CRESTOR) 40 MG tablet Take 40 mg by mouth daily.    VENTOLIN HFA 108 (90 Base) MCG/ACT inhaler Take 2 puffs by mouth every 6 (six) hours.       No Known Allergies    The results of significant diagnostics from this hospitalization (including imaging, microbiology, ancillary and laboratory) are listed below for reference.    Significant Diagnostic Studies: Dg Chest 2 View  Result Date: 04/22/2016 CLINICAL DATA:  Fever for a few days. History of emphysema and hypertension. Evaluate possible sepsis. EXAM: CHEST  2 VIEW COMPARISON:  None. FINDINGS: Cardiomediastinal silhouette is normal. No pleural effusions or focal consolidations. LEFT hilar surgical clips. Trachea projects midline and there is no pneumothorax. Soft tissue planes and included osseous structures are non-suspicious. Old moderate approximate L1 compression fracture. Mild degenerative change of thoracic spine. IMPRESSION: No acute cardiopulmonary process. Electronically Signed   By: Awilda Metro M.D.   On: 04/22/2016 19:11    Microbiology: Recent Results (from the past 240 hour(s))  Culture, blood (Routine x 2)     Status: Abnormal   Collection Time: 04/22/16  6:34 PM  Result Value Ref Range Status   Specimen Description BLOOD BLOOD RIGHT FOREARM  Final   Special Requests BOTTLES DRAWN AEROBIC AND ANAEROBIC 5CC  Final   Culture  Setup Time   Final    GRAM NEGATIVE RODS IN BOTH AEROBIC AND ANAEROBIC BOTTLES Organism ID to follow CRITICAL RESULT CALLED TO, READ BACK BY AND VERIFIED WITH: T. Green Pharm.D. 13:35 04/23/16 ([email or web address]) Performed  at [facility], [address], [place], [place] [address]    Culture ESCHERICHIA COLI (A)  Final   Report Status 04/25/2016 FINAL  Final   Organism ID, Bacteria ESCHERICHIA COLI  Final      Susceptibility   Escherichia coli - MIC*    AMPICILLIN >=32 RESISTANT Resistant     CEFAZOLIN <=4 SENSITIVE Sensitive     CEFEPIME <=1 SENSITIVE Sensitive     CEFTAZIDIME <=1 SENSITIVE Sensitive     CEFTRIAXONE <=1 SENSITIVE Sensitive     CIPROFLOXACIN <=0.25 SENSITIVE Sensitive     GENTAMICIN <=1 SENSITIVE Sensitive     IMIPENEM <=0.25 SENSITIVE Sensitive     TRIMETH/SULFA >=320 RESISTANT Resistant     AMPICILLIN/SULBACTAM 16 INTERMEDIATE Intermediate     PIP/TAZO <=4 SENSITIVE Sensitive     Extended ESBL NEGATIVE Sensitive     * ESCHERICHIA COLI  Blood Culture ID Panel (Reflexed)     Status: Abnormal   Collection Time: 04/22/16  6:34 PM  Result Value Ref Range Status   Enterococcus species NOT DETECTED NOT DETECTED Final   Listeria monocytogenes NOT DETECTED NOT DETECTED Final   Staphylococcus species NOT DETECTED NOT DETECTED Final   Staphylococcus aureus NOT DETECTED NOT DETECTED Final   Streptococcus species NOT DETECTED NOT DETECTED Final   Streptococcus agalactiae NOT DETECTED NOT DETECTED Final   Streptococcus pneumoniae NOT DETECTED NOT DETECTED Final   Streptococcus pyogenes NOT DETECTED NOT DETECTED Final   Acinetobacter baumannii NOT DETECTED NOT DETECTED Final   Enterobacteriaceae species DETECTED (A) NOT DETECTED Final    Comment: Enterobacteriaceae represent a large family of gram-negative bacteria, not a single organism. CRITICAL RESULT CALLED TO, READ BACK BY AND VERIFIED WITH: T. Green Pharm.D. 13:35 04/23/16 ([email or web address])    Enterobacter cloacae complex NOT DETECTED NOT DETECTED Final   Escherichia coli DETECTED (A) NOT DETECTED Final    Comment: CRITICAL RESULT CALLED TO, READ BACK BY AND VERIFIED WITH: T. Green Pharm.D. 13:35 04/23/16 ([email or web address])    Klebsiella oxytoca  NOT DETECTED NOT DETECTED Final   Klebsiella pneumoniae NOT DETECTED NOT DETECTED Final   Proteus species NOT DETECTED NOT DETECTED Final   Serratia marcescens NOT DETECTED NOT DETECTED Final   Carbapenem resistance NOT DETECTED NOT DETECTED Final   Haemophilus influenzae NOT DETECTED NOT DETECTED Final   Neisseria meningitidis NOT DETECTED NOT DETECTED Final   Pseudomonas aeruginosa NOT DETECTED NOT DETECTED Final   Candida albicans NOT DETECTED NOT DETECTED Final   Candida glabrata NOT DETECTED NOT DETECTED Final   Candida krusei NOT DETECTED NOT DETECTED Final   Candida parapsilosis NOT DETECTED NOT DETECTED Final   Candida tropicalis NOT DETECTED NOT DETECTED Final    Comment: Performed at [facility], [address], [place], [place] [address]  Culture, blood (Routine x 2)     Status: Abnormal   Collection Time: 04/22/16  6:43 PM  Result Value Ref Range Status   Specimen Description BLOOD RIGHT WRIST  Final   Special Requests BOTTLES DRAWN AEROBIC AND ANAEROBIC 5CC  Final   Culture  Setup Time   Final    GRAM NEGATIVE RODS IN BOTH AEROBIC AND ANAEROBIC BOTTLES CRITICAL VALUE NOTED.  VALUE IS CONSISTENT WITH PREVIOUSLY REPORTED AND CALLED VALUE.    Culture (A)  Final    ESCHERICHIA COLI SUSCEPTIBILITIES PERFORMED ON PREVIOUS CULTURE WITHIN THE LAST 5 DAYS. Performed at [facility], [address], [place], [place] [address]    Report Status 04/25/2016 FINAL  Final  Urine culture     Status: Abnormal   Collection Time: 04/22/16 11:39 PM  Result Value Ref Range Status   Specimen Description URINE, CLEAN CATCH  Final   Special Requests Normal  Final   Culture (A)  Final    <10,000 COLONIES/mL INSIGNIFICANT GROWTH Performed at [facility], [address] [place], [place] [address]    Report Status 04/24/2016 FINAL  Final  MRSA PCR Screening     Status: None   Collection Time: 04/23/16  2:11 AM  Result Value Ref Range Status   MRSA by PCR NEGATIVE  NEGATIVE Final    Comment:        The GeneXpert MRSA Assay (FDA approved for NASAL specimens only), is one component of a comprehensive MRSA colonization surveillance program. It is not intended to diagnose MRSA infection nor to guide or monitor treatment for  MRSA infections.   Culture, blood (single) w Reflex to ID Panel     Status: None (Preliminary result)   Collection Time: 04/23/16  2:18 PM  Result Value Ref Range Status   Specimen Description BLOOD LEFT HAND  Final   Special Requests IN PEDIATRIC BOTTLE 4 CC  Final   Culture   Final    NO GROWTH < 24 HOURS Performed at [facility], [address] [place], [place] [address]    Report Status PENDING  Incomplete     Labs: Basic Metabolic Panel:  Recent Labs Lab 04/22/16 1842 04/23/16 0401 04/24/16 0529 04/25/16 0519  NA 138 138 135 136  K 3.7 4.7 3.5 3.6  CL 105 109 108 108  CO2 20* 22 20* 22  GLUCOSE 132* 120* 101* 100*  BUN 30* 22* 19 16  CREATININE 1.51* 1.26* 1.03 0.88  CALCIUM 9.6 8.1* 7.7* 8.4*   Liver Function Tests:  Recent Labs Lab 04/22/16 1842 04/24/16 0529  AST 26 18  ALT 22 18  ALKPHOS 60 40  BILITOT 1.3* 1.1  PROT 7.6 5.8*  ALBUMIN 4.2 3.0*   No results for input(s): LIPASE, AMYLASE in the last 168 hours. No results for input(s): AMMONIA in the last 168 hours. CBC:  Recent Labs Lab 04/22/16 1842 04/23/16 0401 04/24/16 0529 04/25/16 0519  WBC 3.8* 8.7 6.5 4.8  NEUTROABS 3.5  --   --   --   HGB 15.5 13.3 12.1* 12.5*  HCT 42.3 38.1* 33.7* 35.0*  MCV 87.8 89.0 87.5 86.8  PLT 133* 129* 104* 110*   Cardiac Enzymes:  Recent Labs Lab 04/23/16 0401 04/23/16 0926 04/23/16 1545  TROPONINI 0.20* 0.22* 0.19*   BNP: BNP (last 3 results) No results for input(s): BNP in the last 8760 hours.  ProBNP (last 3 results) No results for input(s): PROBNP in the last 8760 hours.  CBG: No results for input(s): GLUCAP in the last 168 hours.     SignedRhetta Mura:  Vincent Coleman, JAI-GURMUKH  MD   [facility] 04/25/2016, 1:50 PM

## 2016-04-25 NOTE — Care Management Note (Signed)
Case Management Note  Patient Details  Name: Vincent Coleman MRN: [id number] Date of Birth: [birth year]  Subjective/Objective:                    Action/Plan:d/c home.   Expected Discharge Date:  04/25/16               Expected Discharge Plan:  Home/Self Care  In-House Referral:     Discharge planning Services  CM Consult  Post Acute Care Choice:    Choice offered to:     DME Arranged:    DME Agency:     HH Arranged:    HH Agency:     Status of Service:  Completed, signed off  If discussed at [employer] Length of Stay Meetings, dates discussed:    Additional Comments:  Lanier ClamMahabir, Lorenzo Pereyra, RN 04/25/2016, 1:51 PM

## 2016-04-28 LAB — CULTURE, BLOOD (SINGLE): CULTURE: NO GROWTH

## 2016-05-02 DIAGNOSIS — N183 Chronic kidney disease, stage 3 (moderate): Secondary | ICD-10-CM | POA: Diagnosis not present

## 2016-05-02 DIAGNOSIS — N390 Urinary tract infection, site not specified: Secondary | ICD-10-CM | POA: Diagnosis not present

## 2016-05-02 DIAGNOSIS — Z09 Encounter for follow-up examination after completed treatment for conditions other than malignant neoplasm: Secondary | ICD-10-CM | POA: Diagnosis not present

## 2016-05-22 DIAGNOSIS — M1812 Unilateral primary osteoarthritis of first carpometacarpal joint, left hand: Secondary | ICD-10-CM | POA: Diagnosis not present

## 2016-05-22 DIAGNOSIS — M79645 Pain in left finger(s): Secondary | ICD-10-CM | POA: Diagnosis not present

## 2016-05-23 DIAGNOSIS — N400 Enlarged prostate without lower urinary tract symptoms: Secondary | ICD-10-CM | POA: Diagnosis not present

## 2016-05-23 DIAGNOSIS — M109 Gout, unspecified: Secondary | ICD-10-CM | POA: Diagnosis not present

## 2016-05-23 DIAGNOSIS — Z8619 Personal history of other infectious and parasitic diseases: Secondary | ICD-10-CM | POA: Diagnosis not present

## 2016-05-23 DIAGNOSIS — I129 Hypertensive chronic kidney disease with stage 1 through stage 4 chronic kidney disease, or unspecified chronic kidney disease: Secondary | ICD-10-CM | POA: Diagnosis not present

## 2016-05-23 DIAGNOSIS — E669 Obesity, unspecified: Secondary | ICD-10-CM | POA: Diagnosis not present

## 2016-05-23 DIAGNOSIS — N183 Chronic kidney disease, stage 3 (moderate): Secondary | ICD-10-CM | POA: Diagnosis not present

## 2016-05-28 ENCOUNTER — Other Ambulatory Visit: Payer: Self-pay | Admitting: Nephrology

## 2016-05-28 DIAGNOSIS — N183 Chronic kidney disease, stage 3 unspecified: Secondary | ICD-10-CM

## 2016-05-31 ENCOUNTER — Ambulatory Visit
Admission: RE | Admit: 2016-05-31 | Discharge: 2016-05-31 | Disposition: A | Discharge status: Home / Self Care | Payer: Medicare PPO | Source: Ambulatory Visit | Attending: Nephrology | Admitting: Nephrology

## 2016-05-31 DIAGNOSIS — N183 Chronic kidney disease, stage 3 unspecified: Secondary | ICD-10-CM

## 2016-06-19 DIAGNOSIS — M79645 Pain in left finger(s): Secondary | ICD-10-CM | POA: Diagnosis not present

## 2016-09-19 DIAGNOSIS — N400 Enlarged prostate without lower urinary tract symptoms: Secondary | ICD-10-CM | POA: Diagnosis not present

## 2016-09-19 DIAGNOSIS — I129 Hypertensive chronic kidney disease with stage 1 through stage 4 chronic kidney disease, or unspecified chronic kidney disease: Secondary | ICD-10-CM | POA: Diagnosis not present

## 2016-09-19 DIAGNOSIS — N183 Chronic kidney disease, stage 3 (moderate): Secondary | ICD-10-CM | POA: Diagnosis not present

## 2016-09-19 DIAGNOSIS — E669 Obesity, unspecified: Secondary | ICD-10-CM | POA: Diagnosis not present

## 2016-10-10 DIAGNOSIS — L814 Other melanin hyperpigmentation: Secondary | ICD-10-CM | POA: Diagnosis not present

## 2016-10-10 DIAGNOSIS — Z85828 Personal history of other malignant neoplasm of skin: Secondary | ICD-10-CM | POA: Diagnosis not present

## 2016-10-10 DIAGNOSIS — L570 Actinic keratosis: Secondary | ICD-10-CM | POA: Diagnosis not present

## 2016-10-10 DIAGNOSIS — D1801 Hemangioma of skin and subcutaneous tissue: Secondary | ICD-10-CM | POA: Diagnosis not present

## 2016-10-10 DIAGNOSIS — D0462 Carcinoma in situ of skin of left upper limb, including shoulder: Secondary | ICD-10-CM | POA: Diagnosis not present

## 2016-10-10 DIAGNOSIS — L821 Other seborrheic keratosis: Secondary | ICD-10-CM | POA: Diagnosis not present

## 2016-11-15 DIAGNOSIS — N401 Enlarged prostate with lower urinary tract symptoms: Secondary | ICD-10-CM | POA: Diagnosis not present

## 2016-11-15 DIAGNOSIS — N5201 Erectile dysfunction due to arterial insufficiency: Secondary | ICD-10-CM | POA: Diagnosis not present

## 2016-11-15 DIAGNOSIS — R3915 Urgency of urination: Secondary | ICD-10-CM | POA: Diagnosis not present

## 2016-11-15 DIAGNOSIS — R350 Frequency of micturition: Secondary | ICD-10-CM | POA: Diagnosis not present

## 2016-12-21 DIAGNOSIS — Z23 Encounter for immunization: Secondary | ICD-10-CM | POA: Diagnosis not present

## 2017-01-03 DIAGNOSIS — N5201 Erectile dysfunction due to arterial insufficiency: Secondary | ICD-10-CM | POA: Diagnosis not present

## 2017-01-03 DIAGNOSIS — R3915 Urgency of urination: Secondary | ICD-10-CM | POA: Diagnosis not present

## 2017-01-03 DIAGNOSIS — N401 Enlarged prostate with lower urinary tract symptoms: Secondary | ICD-10-CM | POA: Diagnosis not present

## 2017-01-03 DIAGNOSIS — R350 Frequency of micturition: Secondary | ICD-10-CM | POA: Diagnosis not present

## 2017-01-03 DIAGNOSIS — R351 Nocturia: Secondary | ICD-10-CM | POA: Diagnosis not present

## 2017-01-21 DIAGNOSIS — Z0000 Encounter for general adult medical examination without abnormal findings: Secondary | ICD-10-CM | POA: Diagnosis not present

## 2017-01-21 DIAGNOSIS — Z1211 Encounter for screening for malignant neoplasm of colon: Secondary | ICD-10-CM | POA: Diagnosis not present

## 2017-01-21 DIAGNOSIS — E039 Hypothyroidism, unspecified: Secondary | ICD-10-CM | POA: Diagnosis not present

## 2017-01-21 DIAGNOSIS — N400 Enlarged prostate without lower urinary tract symptoms: Secondary | ICD-10-CM | POA: Diagnosis not present

## 2017-01-21 DIAGNOSIS — I10 Essential (primary) hypertension: Secondary | ICD-10-CM | POA: Diagnosis not present

## 2017-01-21 DIAGNOSIS — E785 Hyperlipidemia, unspecified: Secondary | ICD-10-CM | POA: Diagnosis not present

## 2017-04-11 DIAGNOSIS — L821 Other seborrheic keratosis: Secondary | ICD-10-CM | POA: Diagnosis not present

## 2017-04-11 DIAGNOSIS — D044 Carcinoma in situ of skin of scalp and neck: Secondary | ICD-10-CM | POA: Diagnosis not present

## 2017-04-11 DIAGNOSIS — L814 Other melanin hyperpigmentation: Secondary | ICD-10-CM | POA: Diagnosis not present

## 2017-04-11 DIAGNOSIS — Z85828 Personal history of other malignant neoplasm of skin: Secondary | ICD-10-CM | POA: Diagnosis not present

## 2017-04-11 DIAGNOSIS — D0462 Carcinoma in situ of skin of left upper limb, including shoulder: Secondary | ICD-10-CM | POA: Diagnosis not present

## 2017-04-11 DIAGNOSIS — L570 Actinic keratosis: Secondary | ICD-10-CM | POA: Diagnosis not present

## 2017-04-11 DIAGNOSIS — D1801 Hemangioma of skin and subcutaneous tissue: Secondary | ICD-10-CM | POA: Diagnosis not present

## 2017-04-11 DIAGNOSIS — C4442 Squamous cell carcinoma of skin of scalp and neck: Secondary | ICD-10-CM | POA: Diagnosis not present

## 2017-05-01 DIAGNOSIS — M1990 Unspecified osteoarthritis, unspecified site: Secondary | ICD-10-CM | POA: Diagnosis not present

## 2017-05-02 ENCOUNTER — Ambulatory Visit
Admission: RE | Admit: 2017-05-02 | Discharge: 2017-05-02 | Disposition: A | Discharge status: Home / Self Care | Payer: Medicare PPO | Source: Ambulatory Visit | Attending: Family Medicine | Admitting: Family Medicine

## 2017-05-02 ENCOUNTER — Other Ambulatory Visit: Payer: Self-pay | Admitting: Family Medicine

## 2017-05-02 DIAGNOSIS — M25462 Effusion, left knee: Secondary | ICD-10-CM | POA: Diagnosis not present

## 2017-05-02 DIAGNOSIS — M1990 Unspecified osteoarthritis, unspecified site: Secondary | ICD-10-CM

## 2017-05-02 DIAGNOSIS — M545 Low back pain: Secondary | ICD-10-CM | POA: Diagnosis not present

## 2017-05-17 DIAGNOSIS — I10 Essential (primary) hypertension: Secondary | ICD-10-CM | POA: Diagnosis not present

## 2017-05-17 DIAGNOSIS — M1990 Unspecified osteoarthritis, unspecified site: Secondary | ICD-10-CM | POA: Diagnosis not present

## 2017-05-20 DIAGNOSIS — N401 Enlarged prostate with lower urinary tract symptoms: Secondary | ICD-10-CM | POA: Diagnosis not present

## 2017-05-20 DIAGNOSIS — R351 Nocturia: Secondary | ICD-10-CM | POA: Diagnosis not present

## 2017-05-20 DIAGNOSIS — N5201 Erectile dysfunction due to arterial insufficiency: Secondary | ICD-10-CM | POA: Diagnosis not present

## 2017-05-28 DIAGNOSIS — G5601 Carpal tunnel syndrome, right upper limb: Secondary | ICD-10-CM | POA: Diagnosis not present

## 2017-06-27 DIAGNOSIS — G5601 Carpal tunnel syndrome, right upper limb: Secondary | ICD-10-CM | POA: Diagnosis not present

## 2017-06-27 DIAGNOSIS — M79645 Pain in left finger(s): Secondary | ICD-10-CM | POA: Diagnosis not present

## 2017-09-18 DIAGNOSIS — M109 Gout, unspecified: Secondary | ICD-10-CM | POA: Diagnosis not present

## 2017-09-18 DIAGNOSIS — I129 Hypertensive chronic kidney disease with stage 1 through stage 4 chronic kidney disease, or unspecified chronic kidney disease: Secondary | ICD-10-CM | POA: Diagnosis not present

## 2017-09-18 DIAGNOSIS — N183 Chronic kidney disease, stage 3 (moderate): Secondary | ICD-10-CM | POA: Diagnosis not present

## 2017-09-18 DIAGNOSIS — N400 Enlarged prostate without lower urinary tract symptoms: Secondary | ICD-10-CM | POA: Diagnosis not present

## 2017-09-18 DIAGNOSIS — E785 Hyperlipidemia, unspecified: Secondary | ICD-10-CM | POA: Diagnosis not present

## 2017-09-24 DIAGNOSIS — N183 Chronic kidney disease, stage 3 (moderate): Secondary | ICD-10-CM | POA: Diagnosis not present

## 2017-10-03 DIAGNOSIS — R0609 Other forms of dyspnea: Secondary | ICD-10-CM | POA: Diagnosis not present

## 2017-10-08 DIAGNOSIS — R0609 Other forms of dyspnea: Secondary | ICD-10-CM

## 2017-10-08 DIAGNOSIS — I10 Essential (primary) hypertension: Secondary | ICD-10-CM | POA: Insufficient documentation

## 2017-10-08 DIAGNOSIS — R300 Dysuria: Secondary | ICD-10-CM | POA: Insufficient documentation

## 2017-10-09 ENCOUNTER — Encounter: Payer: Self-pay | Admitting: Cardiovascular Disease

## 2017-10-09 ENCOUNTER — Ambulatory Visit: Payer: Medicare PPO | Admitting: Cardiovascular Disease

## 2017-10-09 VITALS — BP 130/78 | Ht 71.0 in | Wt 216.0 lb

## 2017-10-09 DIAGNOSIS — E7800 Pure hypercholesterolemia, unspecified: Secondary | ICD-10-CM

## 2017-10-09 DIAGNOSIS — R0609 Other forms of dyspnea: Secondary | ICD-10-CM | POA: Diagnosis not present

## 2017-10-09 DIAGNOSIS — R0989 Other specified symptoms and signs involving the circulatory and respiratory systems: Secondary | ICD-10-CM

## 2017-10-09 DIAGNOSIS — I10 Essential (primary) hypertension: Secondary | ICD-10-CM

## 2017-10-09 DIAGNOSIS — E785 Hyperlipidemia, unspecified: Secondary | ICD-10-CM | POA: Insufficient documentation

## 2017-10-09 NOTE — Assessment & Plan Note (Signed)
History of hyperlipidemia on statin therapy with lipid profile performed 12/18 revealing total cholesterol of 181, LDL 106 and HDL 48.

## 2017-10-09 NOTE — Progress Notes (Signed)
10/09/2017 Vincent Coleman   Nov 08, 1945  [id number]  Primary Physician Shirlean Mylar, MD Primary Cardiologist: Runell Gess MD Nicholes Calam[place], [place]  HPI:  Vincent Coleman is a 72 y.o. mildly overweight married Caucasian male father of 2 daughters, grandfather 3 grandchildren retired from running a bread route.  He was referred by Dr. Shirlean Mylar for cardiovascular evaluation because of progressive dyspnea on exertion.  His risk factors include greater than 100 pack years of tobacco abuse having quit 20 years ago at the time of his laryngeal cancer.  He has treated hypertension and hyperlipidemia.  Is not diabetic.  There is no family history of heart disease.  Is never had a heart attack or stroke.  He noticed increasing dyspnea several months ago walking up steps while at [employer].  Since that time when walking up an incline he has noticed increasing dyspnea new since 1 year ago.   Current Meds  Medication Sig  . aspirin EC 81 MG EC tablet Take 1 tablet (81 mg total) by mouth daily.  . cefpodoxime (VANTIN) 200 MG tablet Take 1 tablet (200 mg total) by mouth every 12 (twelve) hours.  . hydrochlorothiazide (MICROZIDE) 12.5 MG capsule Take 1 capsule (12.5 mg total) by mouth daily.  Marland Kitchen levothyroxine (SYNTHROID, LEVOTHROID) 137 MCG tablet Take 137 mcg by mouth daily.  Marland Kitchen lisinopril-hydrochlorothiazide (PRINZIDE,ZESTORETIC) 20-12.5 MG tablet Take 1 tablet by mouth daily.  Marland Kitchen omeprazole (PRILOSEC) 20 MG capsule Take 20 mg by mouth 2 (two) times daily.  . Pseudoeph-Doxylamine-DM-APAP (NYQUIL PO) Take 30 mLs by mouth as needed (sleep, sore throat).  . rosuvastatin (CRESTOR) 40 MG tablet Take 40 mg by mouth daily.  . tamsulosin (FLOMAX) 0.4 MG CAPS capsule Take 1 capsule (0.4 mg total) by mouth daily after breakfast.  . URELLE (URELLE/URISED) 81 MG TABS tablet Take 1 tablet (81 mg total) by mouth 3 (three) times daily as needed for bladder spasms.  . VENTOLIN HFA 108 (90 Base) MCG/ACT  inhaler Take 2 puffs by mouth every 6 (six) hours.     No Known Allergies  Social History   Socioeconomic History  . Marital status: Married    Spouse name: Not on file  . Number of children: Not on file  . Years of education: Not on file  . Highest education level: Not on file  Occupational History  . Not on file  Social Needs  . Financial resource strain: Not on file  . Food insecurity:    Worry: Not on file    Inability: Not on file  . Transportation needs:    Medical: Not on file    Non-medical: Not on file  Tobacco Use  . Smoking status: Former Games developer  . Smokeless tobacco: Never Used  Substance and Sexual Activity  . Alcohol use: No  . Drug use: No  . Sexual activity: Not on file  Lifestyle  . Physical activity:    Days per week: Not on file    Minutes per session: Not on file  . Stress: Not on file  Relationships  . Social connections:    Talks on phone: Not on file    Gets together: Not on file    Attends religious service: Not on file    Active member of club or organization: Not on file    Attends meetings of clubs or organizations: Not on file    Relationship status: Not on file  . Intimate partner violence:    Fear of current  or ex partner: Not on file    Emotionally abused: Not on file    Physically abused: Not on file    Forced sexual activity: Not on file  Other Topics Concern  . Not on file  Social History Narrative  . Not on file     Review of Systems: General: negative for chills, fever, night sweats or weight changes.  Cardiovascular: negative for chest pain, dyspnea on exertion, edema, orthopnea, palpitations, paroxysmal nocturnal dyspnea or shortness of breath Dermatological: negative for rash Respiratory: negative for cough or wheezing Urologic: negative for hematuria Abdominal: negative for nausea, vomiting, diarrhea, bright red blood per rectum, melena, or hematemesis Neurologic: negative for visual changes, syncope, or  dizziness All other systems reviewed and are otherwise negative except as noted above.    Blood pressure 130/78, height 5\' 11"  (1.803 m), weight 216 lb (98 kg).  General appearance: alert and no distress Neck: no adenopathy, no JVD, supple, symmetrical, trachea midline, thyroid not enlarged, symmetric, no tenderness/mass/nodules and Soft right carotid bruit Lungs: clear to auscultation bilaterally Heart: regular rate and rhythm, S1, S2 normal, no murmur, click, rub or gallop Extremities: extremities normal, atraumatic, no cyanosis or edema Pulses: 2+ and symmetric Skin: Skin color, texture, turgor normal. No rashes or lesions Neurologic: Alert and oriented X 3, normal strength and tone. Normal symmetric reflexes. Normal coordination and gait  EKG sinus rhythm at 80 with incomplete right bundle branch block.  I personally reviewed this EKG  ASSESSMENT AND PLAN:   DOE (dyspnea on exertion) Several month history of dyspnea on exertion.  He does have greater than 100-pack-year history tobacco abuse ago at the time of his laryngeal cancer.  Over the last several months he is noticed increasing dyspnea on exertion.  He denies chest pain.  I am going to get exercise Myoview stress test as well as a 2D echo to further evaluate.  Hypertension History of essential hypertension with blood pressure measured today at 130/78.  He is on hydrochlorothiazide, and lisinopril.  Continue current meds at current dosing.  Hyperlipidemia History of hyperlipidemia on statin therapy with lipid profile performed 12/18 revealing total cholesterol of 181, LDL 106 and HDL 48.      Runell GessJonathan J. Jonn Chaikin MD FACP,FACC,FAHA, [facility] 10/09/2017 10:23 AM

## 2017-10-09 NOTE — Assessment & Plan Note (Signed)
History of essential hypertension with blood pressure measured today at 130/78.  He is on hydrochlorothiazide, and lisinopril.  Continue current meds at current dosing.

## 2017-10-09 NOTE — Patient Instructions (Signed)
Medication Instructions:  Your physician recommends that you continue on your current medications as directed. Please refer to the Current Medication list given to you today.   Labwork: none  Testing/Procedures: Your physician has requested that you have an echocardiogram. Echocardiography is a painless test that uses sound waves to create images of your heart. It provides your doctor with information about the size and shape of your heart and how well your heart's chambers and valves are working. This procedure takes approximately one hour. There are no restrictions for this procedure.  Your physician has requested that you have en exercise stress myoview. For further information please visit [email or web address]. Please follow instruction sheet, as given.  Your physician has requested that you have a carotid duplex. This test is an ultrasound of the carotid arteries in your neck. It looks at blood flow through these arteries that supply the brain with blood. Allow one hour for this exam. There are no restrictions or special instructions.    Follow-Up: Your physician wants you to follow-up in: 12 months with Dr. Allyson SabalBerry. You will receive a reminder letter in the mail two months in advance. If you don't receive a letter, please call our office to schedule the follow-up appointment.   Any Other Special Instructions Will Be Listed Below (If Applicable).     If you need a refill on your cardiac medications before your next appointment, please call your pharmacy.

## 2017-10-09 NOTE — Assessment & Plan Note (Signed)
Several month history of dyspnea on exertion.  He does have greater than 100-pack-year history tobacco abuse ago at the time of his laryngeal cancer.  Over the last several months he is noticed increasing dyspnea on exertion.  He denies chest pain.  I am going to get exercise Myoview stress test as well as a 2D echo to further evaluate.

## 2017-10-10 ENCOUNTER — Other Ambulatory Visit: Payer: Self-pay | Admitting: Cardiovascular Disease

## 2017-10-10 DIAGNOSIS — R0989 Other specified symptoms and signs involving the circulatory and respiratory systems: Secondary | ICD-10-CM

## 2017-10-11 DIAGNOSIS — L821 Other seborrheic keratosis: Secondary | ICD-10-CM | POA: Diagnosis not present

## 2017-10-11 DIAGNOSIS — D485 Neoplasm of uncertain behavior of skin: Secondary | ICD-10-CM | POA: Diagnosis not present

## 2017-10-11 DIAGNOSIS — L814 Other melanin hyperpigmentation: Secondary | ICD-10-CM | POA: Diagnosis not present

## 2017-10-11 DIAGNOSIS — L820 Inflamed seborrheic keratosis: Secondary | ICD-10-CM | POA: Diagnosis not present

## 2017-10-11 DIAGNOSIS — L570 Actinic keratosis: Secondary | ICD-10-CM | POA: Diagnosis not present

## 2017-10-11 DIAGNOSIS — Z85828 Personal history of other malignant neoplasm of skin: Secondary | ICD-10-CM | POA: Diagnosis not present

## 2017-10-11 DIAGNOSIS — D225 Melanocytic nevi of trunk: Secondary | ICD-10-CM | POA: Diagnosis not present

## 2017-10-16 ENCOUNTER — Ambulatory Visit (HOSPITAL_COMMUNITY): Payer: Medicare PPO | Attending: Cardiology

## 2017-10-16 ENCOUNTER — Other Ambulatory Visit: Payer: Self-pay

## 2017-10-16 DIAGNOSIS — I10 Essential (primary) hypertension: Secondary | ICD-10-CM

## 2017-10-16 DIAGNOSIS — R0609 Other forms of dyspnea: Secondary | ICD-10-CM

## 2017-10-16 DIAGNOSIS — E7800 Pure hypercholesterolemia, unspecified: Secondary | ICD-10-CM | POA: Diagnosis not present

## 2017-10-17 ENCOUNTER — Telehealth (HOSPITAL_COMMUNITY): Payer: Self-pay

## 2017-10-17 NOTE — Telephone Encounter (Signed)
Encounter complete. 

## 2017-10-22 ENCOUNTER — Ambulatory Visit (HOSPITAL_COMMUNITY)
Admission: RE | Admit: 2017-10-22 | Discharge: 2017-10-22 | Disposition: A | Discharge status: Home / Self Care | Payer: Medicare PPO | Source: Ambulatory Visit | Attending: Cardiology | Admitting: Cardiology

## 2017-10-22 DIAGNOSIS — R0609 Other forms of dyspnea: Secondary | ICD-10-CM | POA: Diagnosis not present

## 2017-10-22 DIAGNOSIS — E7800 Pure hypercholesterolemia, unspecified: Secondary | ICD-10-CM | POA: Diagnosis not present

## 2017-10-22 DIAGNOSIS — I10 Essential (primary) hypertension: Secondary | ICD-10-CM | POA: Diagnosis not present

## 2017-10-22 DIAGNOSIS — R0989 Other specified symptoms and signs involving the circulatory and respiratory systems: Secondary | ICD-10-CM | POA: Diagnosis not present

## 2017-10-22 LAB — MYOCARDIAL PERFUSION IMAGING
CHL CUP MPHR: 147 {beats}/min
CHL CUP NUCLEAR SDS: 2
CHL CUP NUCLEAR SSS: 2
CHL CUP RESTING HR STRESS: 77 {beats}/min
CSEPED: 6 min
CSEPEDS: 0 s
CSEPEW: 7 METS
CSEPHR: 92 %
CSEPPHR: 136 {beats}/min
LV dias vol: 92 mL (ref 62–150)
LV sys vol: 50 mL
RPE: 19
SRS: 0
TID: 1.16

## 2017-10-22 MED ORDER — TECHNETIUM TC 99M TETROFOSMIN IV KIT
10.2000 | PACK | Freq: Once | INTRAVENOUS | Status: AC | PRN
  Administered 2017-10-22: 10.2 via INTRAVENOUS
  Filled 2017-10-22: qty 11

## 2017-10-22 MED ORDER — TECHNETIUM TC 99M TETROFOSMIN IV KIT
30.8000 | PACK | Freq: Once | INTRAVENOUS | Status: AC | PRN
  Administered 2017-10-22: 30.8 via INTRAVENOUS
  Filled 2017-10-22: qty 31

## 2017-10-29 ENCOUNTER — Other Ambulatory Visit: Payer: Self-pay | Admitting: *Deleted

## 2017-10-29 DIAGNOSIS — I6523 Occlusion and stenosis of bilateral carotid arteries: Secondary | ICD-10-CM

## 2017-11-22 DIAGNOSIS — N401 Enlarged prostate with lower urinary tract symptoms: Secondary | ICD-10-CM | POA: Diagnosis not present

## 2017-11-22 DIAGNOSIS — R3915 Urgency of urination: Secondary | ICD-10-CM | POA: Diagnosis not present

## 2017-12-17 DIAGNOSIS — Z23 Encounter for immunization: Secondary | ICD-10-CM | POA: Diagnosis not present

## 2018-02-18 DIAGNOSIS — E039 Hypothyroidism, unspecified: Secondary | ICD-10-CM | POA: Diagnosis not present

## 2018-02-18 DIAGNOSIS — I10 Essential (primary) hypertension: Secondary | ICD-10-CM | POA: Diagnosis not present

## 2018-02-18 DIAGNOSIS — Z1211 Encounter for screening for malignant neoplasm of colon: Secondary | ICD-10-CM | POA: Diagnosis not present

## 2018-02-18 DIAGNOSIS — I700 Atherosclerosis of aorta: Secondary | ICD-10-CM | POA: Diagnosis not present

## 2018-02-18 DIAGNOSIS — Z0000 Encounter for general adult medical examination without abnormal findings: Secondary | ICD-10-CM | POA: Diagnosis not present

## 2018-02-18 DIAGNOSIS — E785 Hyperlipidemia, unspecified: Secondary | ICD-10-CM | POA: Diagnosis not present

## 2018-04-22 DIAGNOSIS — L570 Actinic keratosis: Secondary | ICD-10-CM | POA: Diagnosis not present

## 2018-04-22 DIAGNOSIS — L821 Other seborrheic keratosis: Secondary | ICD-10-CM | POA: Diagnosis not present

## 2018-04-22 DIAGNOSIS — D2261 Melanocytic nevi of right upper limb, including shoulder: Secondary | ICD-10-CM | POA: Diagnosis not present

## 2018-04-22 DIAGNOSIS — D2272 Melanocytic nevi of left lower limb, including hip: Secondary | ICD-10-CM | POA: Diagnosis not present

## 2018-04-22 DIAGNOSIS — D0462 Carcinoma in situ of skin of left upper limb, including shoulder: Secondary | ICD-10-CM | POA: Diagnosis not present

## 2018-04-22 DIAGNOSIS — Z85828 Personal history of other malignant neoplasm of skin: Secondary | ICD-10-CM | POA: Diagnosis not present

## 2018-04-22 DIAGNOSIS — D1801 Hemangioma of skin and subcutaneous tissue: Secondary | ICD-10-CM | POA: Diagnosis not present

## 2018-04-22 DIAGNOSIS — D2262 Melanocytic nevi of left upper limb, including shoulder: Secondary | ICD-10-CM | POA: Diagnosis not present

## 2018-04-22 DIAGNOSIS — D225 Melanocytic nevi of trunk: Secondary | ICD-10-CM | POA: Diagnosis not present

## 2018-08-15 DIAGNOSIS — E039 Hypothyroidism, unspecified: Secondary | ICD-10-CM | POA: Diagnosis not present

## 2018-08-15 DIAGNOSIS — N183 Chronic kidney disease, stage 3 (moderate): Secondary | ICD-10-CM | POA: Diagnosis not present

## 2018-08-15 DIAGNOSIS — K219 Gastro-esophageal reflux disease without esophagitis: Secondary | ICD-10-CM | POA: Diagnosis not present

## 2018-08-15 DIAGNOSIS — I129 Hypertensive chronic kidney disease with stage 1 through stage 4 chronic kidney disease, or unspecified chronic kidney disease: Secondary | ICD-10-CM | POA: Diagnosis not present

## 2018-08-15 DIAGNOSIS — M109 Gout, unspecified: Secondary | ICD-10-CM | POA: Diagnosis not present

## 2018-08-15 DIAGNOSIS — H547 Unspecified visual loss: Secondary | ICD-10-CM | POA: Diagnosis not present

## 2018-08-15 DIAGNOSIS — J449 Chronic obstructive pulmonary disease, unspecified: Secondary | ICD-10-CM | POA: Diagnosis not present

## 2018-08-15 DIAGNOSIS — E785 Hyperlipidemia, unspecified: Secondary | ICD-10-CM | POA: Diagnosis not present

## 2018-08-15 DIAGNOSIS — M19042 Primary osteoarthritis, left hand: Secondary | ICD-10-CM | POA: Diagnosis not present

## 2018-09-05 IMAGING — CR DG CHEST 2V
2 series · 2 of 2 positions shown · non-contrast
Comparison: None.

CLINICAL DATA: Fever for a few days. History of emphysema and
hypertension. Evaluate possible sepsis.

EXAM:
CHEST  2 VIEW

[w chest lat]
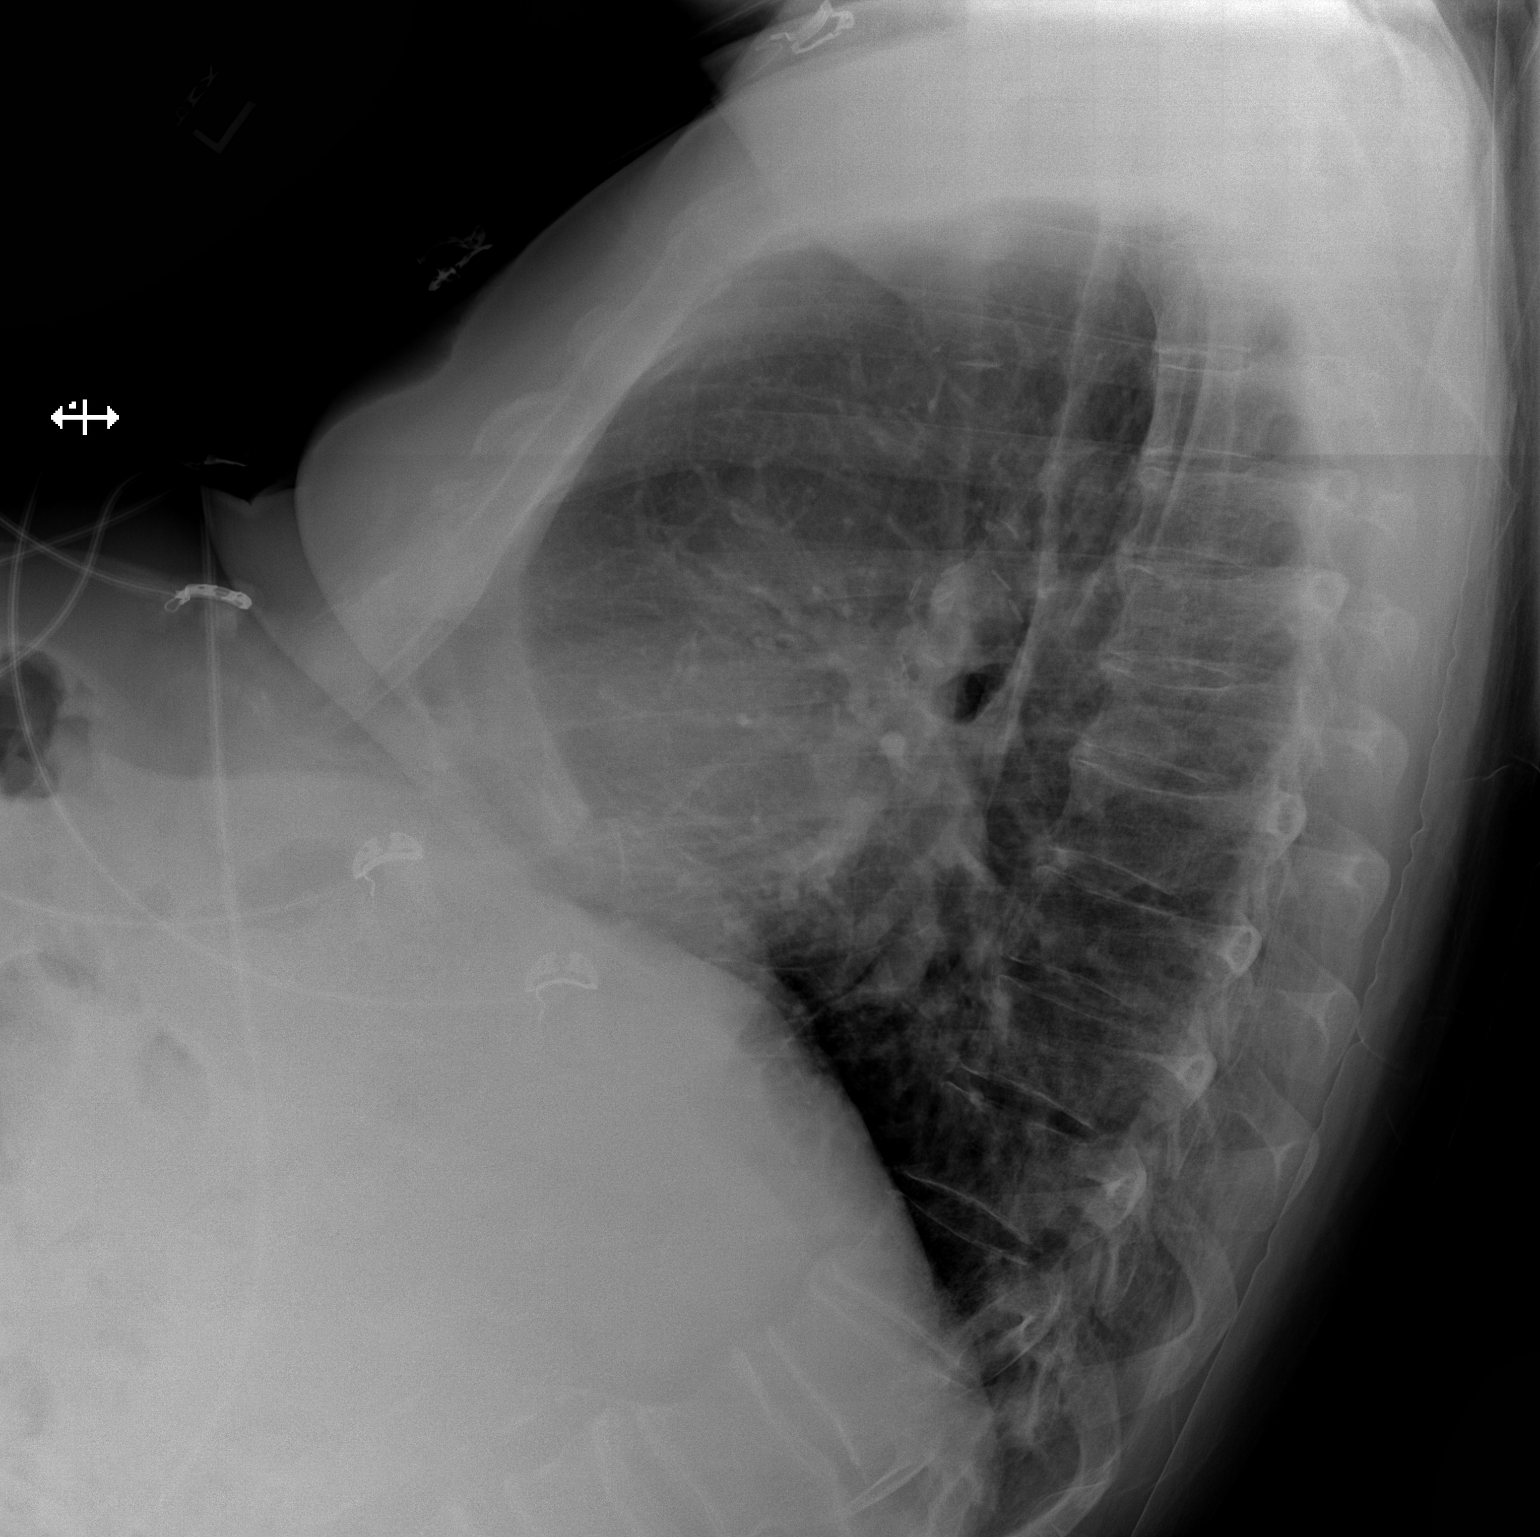

[x chest ap]
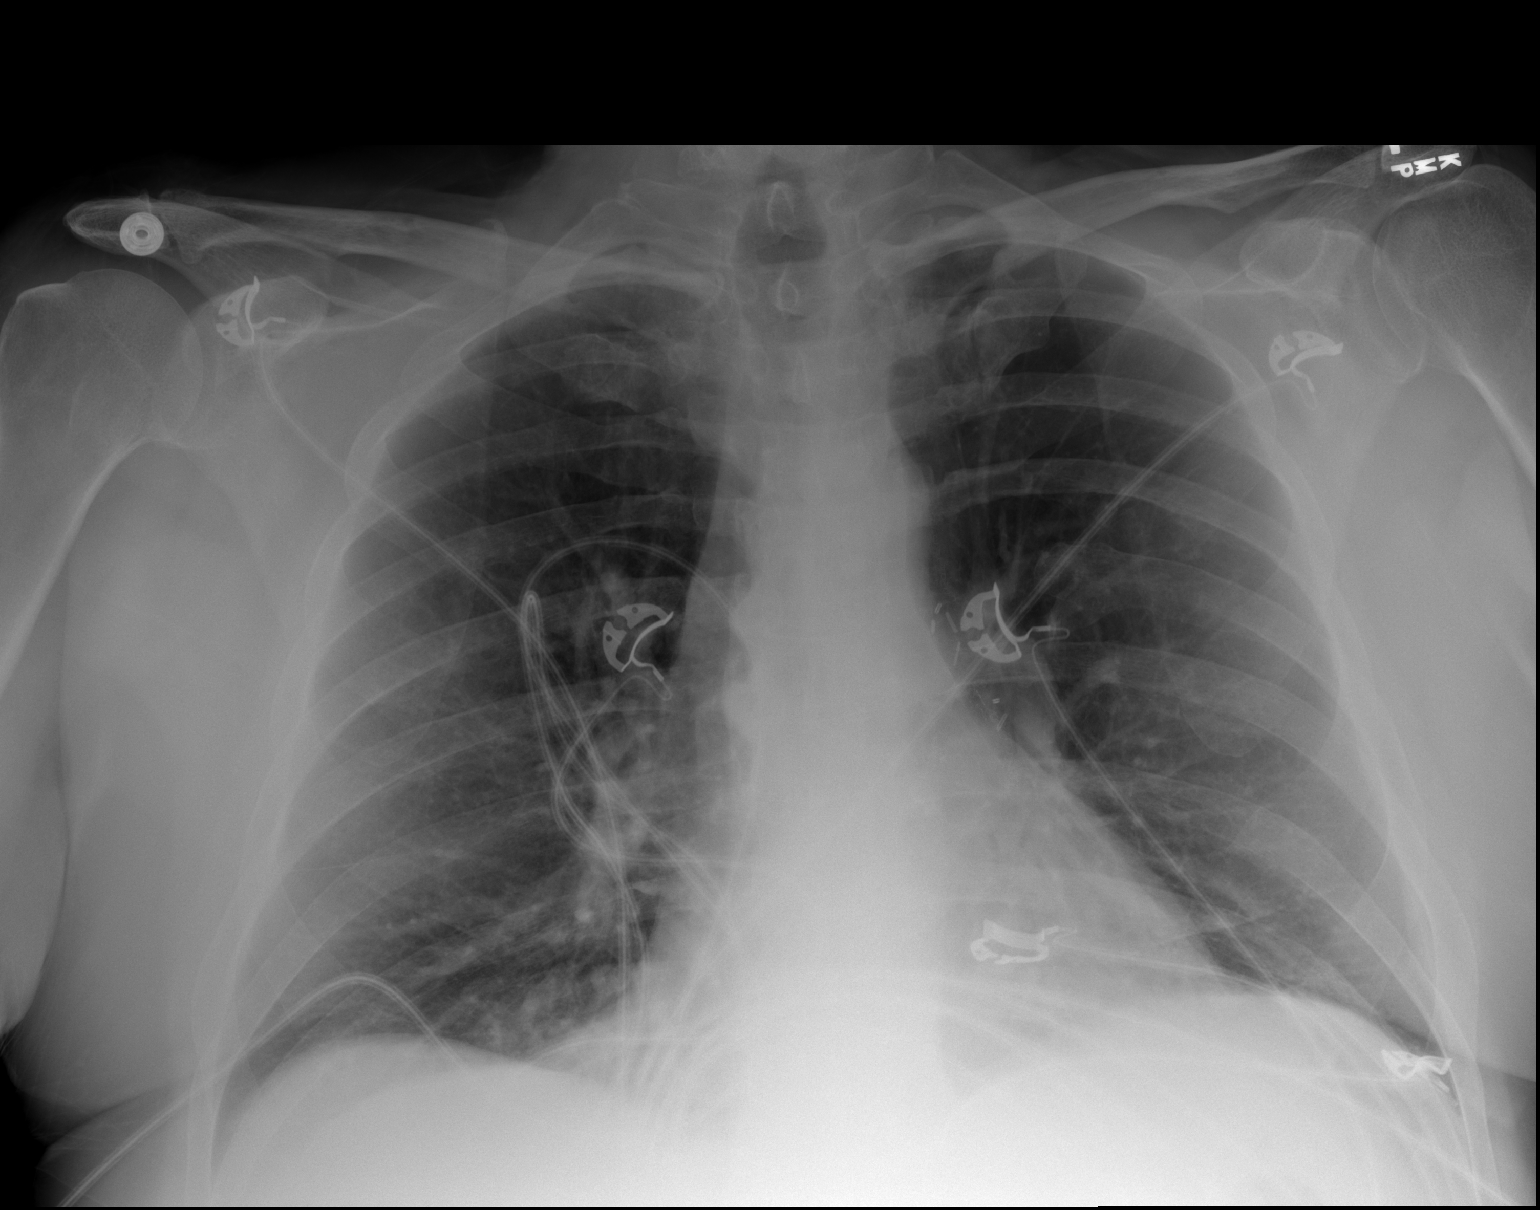

[2 of 2 positions shown; findings below may reference images not displayed]

FINDINGS: Cardiomediastinal silhouette is normal. No pleural effusions or
focal consolidations. LEFT hilar surgical clips. Trachea projects
midline and there is no pneumothorax. Soft tissue planes and
included osseous structures are non-suspicious. Old moderate
approximate L1 compression fracture. Mild degenerative change of
thoracic spine.
IMPRESSION: No acute cardiopulmonary process.

## 2018-10-24 ENCOUNTER — Ambulatory Visit (HOSPITAL_COMMUNITY)
Admission: RE | Admit: 2018-10-24 | Discharge: 2018-10-24 | Disposition: A | Discharge status: Home / Self Care | Payer: Medicare PPO | Source: Ambulatory Visit | Attending: Cardiology | Admitting: Cardiology

## 2018-10-24 ENCOUNTER — Other Ambulatory Visit: Payer: Self-pay

## 2018-10-24 ENCOUNTER — Other Ambulatory Visit (HOSPITAL_COMMUNITY): Payer: Self-pay | Admitting: Cardiovascular Disease

## 2018-10-24 DIAGNOSIS — I6523 Occlusion and stenosis of bilateral carotid arteries: Secondary | ICD-10-CM | POA: Insufficient documentation

## 2018-10-27 ENCOUNTER — Other Ambulatory Visit: Payer: Self-pay | Admitting: *Deleted

## 2018-10-27 DIAGNOSIS — D225 Melanocytic nevi of trunk: Secondary | ICD-10-CM | POA: Diagnosis not present

## 2018-10-27 DIAGNOSIS — D692 Other nonthrombocytopenic purpura: Secondary | ICD-10-CM | POA: Diagnosis not present

## 2018-10-27 DIAGNOSIS — D1801 Hemangioma of skin and subcutaneous tissue: Secondary | ICD-10-CM | POA: Diagnosis not present

## 2018-10-27 DIAGNOSIS — L821 Other seborrheic keratosis: Secondary | ICD-10-CM | POA: Diagnosis not present

## 2018-10-27 DIAGNOSIS — C4441 Basal cell carcinoma of skin of scalp and neck: Secondary | ICD-10-CM | POA: Diagnosis not present

## 2018-10-27 DIAGNOSIS — Z85828 Personal history of other malignant neoplasm of skin: Secondary | ICD-10-CM | POA: Diagnosis not present

## 2018-10-27 DIAGNOSIS — L570 Actinic keratosis: Secondary | ICD-10-CM | POA: Diagnosis not present

## 2018-10-27 DIAGNOSIS — I6523 Occlusion and stenosis of bilateral carotid arteries: Secondary | ICD-10-CM

## 2018-11-06 DIAGNOSIS — N401 Enlarged prostate with lower urinary tract symptoms: Secondary | ICD-10-CM | POA: Diagnosis not present

## 2018-11-06 DIAGNOSIS — C329 Malignant neoplasm of larynx, unspecified: Secondary | ICD-10-CM | POA: Diagnosis not present

## 2018-11-06 DIAGNOSIS — N183 Chronic kidney disease, stage 3 (moderate): Secondary | ICD-10-CM | POA: Diagnosis not present

## 2018-11-06 DIAGNOSIS — I129 Hypertensive chronic kidney disease with stage 1 through stage 4 chronic kidney disease, or unspecified chronic kidney disease: Secondary | ICD-10-CM | POA: Diagnosis not present

## 2018-11-06 DIAGNOSIS — M109 Gout, unspecified: Secondary | ICD-10-CM | POA: Diagnosis not present

## 2018-11-06 DIAGNOSIS — E039 Hypothyroidism, unspecified: Secondary | ICD-10-CM | POA: Diagnosis not present

## 2018-11-07 DIAGNOSIS — H5203 Hypermetropia, bilateral: Secondary | ICD-10-CM | POA: Diagnosis not present

## 2018-11-07 DIAGNOSIS — H2513 Age-related nuclear cataract, bilateral: Secondary | ICD-10-CM | POA: Diagnosis not present

## 2018-11-07 DIAGNOSIS — H524 Presbyopia: Secondary | ICD-10-CM | POA: Diagnosis not present

## 2018-11-07 DIAGNOSIS — N183 Chronic kidney disease, stage 3 (moderate): Secondary | ICD-10-CM | POA: Diagnosis not present

## 2018-11-07 DIAGNOSIS — H52203 Unspecified astigmatism, bilateral: Secondary | ICD-10-CM | POA: Diagnosis not present

## 2018-11-14 DIAGNOSIS — Z125 Encounter for screening for malignant neoplasm of prostate: Secondary | ICD-10-CM | POA: Diagnosis not present

## 2018-11-20 NOTE — Progress Notes (Signed)
Virtual Visit via Telephone Note   This visit type was conducted due to national recommendations for restrictions regarding the COVID-19 Pandemic (e.g. social distancing) in an effort to limit this patient's exposure and mitigate transmission in our community.  Due to his co-morbid illnesses, this patient is at least at moderate risk for complications without adequate follow up.  This format is felt to be most appropriate for this patient at this time.  The patient did not have access to video technology/had technical difficulties with video requiring transitioning to audio format only (telephone).  All issues noted in this document were discussed and addressed.  No physical exam could be performed with this format.  Please refer to the patient's chart for his  consent to telehealth for Vincent Coleman.   Date:  11/20/2018   ID:  Vincent Coleman, DOB [birth year], MRN [id number]  Patient Location: Home Provider Location: Home  PCP:  Shirlean MylarWebb, Carol, MD  Cardiologist: Dr. Nanetta BattyJonathan Deaja Rizo Electrophysiologist:  None   Evaluation Performed:  Follow-Up Visit  Chief Complaint: Follow-up dyspnea on exertion  History of Present Illness:    Vincent ChristmasRichard Coleman is a 73 y.o. mildly overweight married Caucasian male father of 2 daughters, grandfather 3 grandchildren retired from running a bread route.  He was referred by Dr. Shirlean Mylararol Webb for cardiovascular evaluation because of progressive dyspnea on exertion.  I last saw him in the office 10/09/2017. His risk factors include greater than 100 pack years of tobacco abuse having quit 20 years ago at the time of his laryngeal cancer.  He has treated hypertension and hyperlipidemia.  Is not diabetic.  There is no family history of heart disease.  Is never had a heart attack or stroke.  He noticed increasing dyspnea several months ago [place]  Since that time when walking up an incline he has noticed increasing dyspnea new since 1 year ago.  Since I saw him a year ago he has done well.  His dyspnea has remained stable.  He did have a 2D echo performed 10/16/2017 which was normal and a Myoview stress test performed 10/22/2017 that showed no ischemia.  He also had carotid Dopplers performed 10/24/2018 that showed moderate bilateral ICA stenosis which we will follow on an annual basis.      The patient does not have symptoms concerning for COVID-19 infection (fever, chills, cough, or new shortness of breath).    Past Medical History:  Diagnosis Date  . CKD (chronic kidney disease)   . DOE (dyspnea on exertion)   . Dysuria   . Emphysema of lung (HCC)   . History of throat cancer   . Hyperlipidemia   . Hypertension    No past surgical history on file.   No outpatient medications have been marked as taking for the 11/21/18 encounter (Appointment) with Runell GessBerry, Anyah Swallow J, MD.     Allergies:   Patient has no known allergies.   Social History   Tobacco Use  . Smoking status: Former Games developermoker  . Smokeless tobacco: Never Used  Substance Use Topics  . Alcohol use: No  . Drug use: No     Family Hx: The patient's family history includes Cancer in his father.  ROS:   Please see the history of present illness.     All other systems reviewed and are negative.   Prior CV studies:   The following studies were reviewed today:  Reviewed 2D echocardiogram performed 10/16/2017 and Myoview stress test performed 10/22/2017 as  well as carotid Dopplers performed 10/23/2017  Labs/Other Tests and Data Reviewed:    EKG:  No ECG reviewed.  Recent Labs: No results found for requested labs within last 8760 hours.   Recent Lipid Panel No results found for: CHOL, TRIG, HDL, CHOLHDL, LDLCALC, LDLDIRECT  Wt Readings from Last 3 Encounters:  10/22/17 216 lb (98 kg)  10/09/17 216 lb (98 kg)  04/23/16 222 lb 0.1 oz (100.7 kg)     Objective:    Vital Signs:  There were no vitals taken for this visit.   VITAL SIGNS:  reviewed a complete  physical exam was not performed today since this was a virtual telemedicine phone visit  ASSESSMENT & PLAN:    1. Dyspnea on exertion- history of dyspnea on exertion with over 100 pack years of tobacco abuse having quit 20 years ago with normal 2D echo and Myoview stress test.  I suspect this is primary pulmonary.  He does not use inhaled bronchodilators.  His symptoms have remained stable over the last 12 months. 2. Hyperlipidemia- history of hyperlipidemia on Crestor with lipid profile performed 02/18/2018 revealing total cholesterol 153, LDL of 91 and HDL 43 3. Essential hypertension- history of essential hypertension with blood pressure measured today by the patient at home of 126/69 with a pulse of 102.  He is on hydrochlorothiazide and lisinopril.  He does take his blood pressure routinely at home. 4. Carotid artery disease- moderate bilateral ICA stenosis by duplex ultrasound 10/24/2018 which will be repeated on an annual basis.  COVID-19 Education: The signs and symptoms of COVID-19 were discussed with the patient and how to seek care for testing (follow up with PCP or arrange E-visit).  The importance of social distancing was discussed today.  Time:   Today, I have spent 5 minutes with the patient with telehealth technology discussing the above problems.     Medication Adjustments/Labs and Tests Ordered: Current medicines are reviewed at length with the patient today.  Concerns regarding medicines are outlined above.   Tests Ordered: No orders of the defined types were placed in this encounter.   Medication Changes: No orders of the defined types were placed in this encounter.   Follow Up:  In Person in 1 year(s)  Signed, Quay Burow, MD  11/20/2018 5:58 PM    [place]

## 2018-11-21 ENCOUNTER — Telehealth (INDEPENDENT_AMBULATORY_CARE_PROVIDER_SITE_OTHER): Payer: Medicare PPO | Admitting: Cardiovascular Disease

## 2018-11-21 ENCOUNTER — Telehealth: Payer: Self-pay

## 2018-11-21 DIAGNOSIS — R0609 Other forms of dyspnea: Secondary | ICD-10-CM

## 2018-11-21 DIAGNOSIS — E782 Mixed hyperlipidemia: Secondary | ICD-10-CM | POA: Diagnosis not present

## 2018-11-21 DIAGNOSIS — I10 Essential (primary) hypertension: Secondary | ICD-10-CM

## 2018-11-21 DIAGNOSIS — N401 Enlarged prostate with lower urinary tract symptoms: Secondary | ICD-10-CM | POA: Diagnosis not present

## 2018-11-21 DIAGNOSIS — R3915 Urgency of urination: Secondary | ICD-10-CM | POA: Diagnosis not present

## 2018-11-21 NOTE — Telephone Encounter (Signed)
Patient and/or DPR-approved person aware of 9/11 AVS instructions and verbalized understanding.  Letter including After Visit Summary and any other necessary documents to be mailed to the patient's address on file.   

## 2018-11-21 NOTE — Patient Instructions (Addendum)
Medication Instructions:  Your physician recommends that you continue on your current medications as directed. Please refer to the Current Medication list given to you today.  If you need a refill on your cardiac medications before your next appointment, please call your pharmacy.   Lab work: none If you have labs (blood work) drawn today and your tests are completely normal, you will receive your results only by: Marland Kitchen MyChart Message (if you have MyChart) OR . A paper copy in the mail If you have any lab test that is abnormal or we need to change your treatment, we will call you to review the results.  Testing/Procedures: Your physician has requested that you have a carotid duplex. This test is an ultrasound of the carotid arteries in your neck. It looks at blood flow through these arteries that supply the brain with blood. Allow one hour for this exam. There are no restrictions or special instructions. DUE IN 12 MONTHS  Follow-Up: At [facility], you and your health needs are our priority.  As part of our continuing mission to provide you with exceptional heart care, we have created designated Provider Care Teams.  These Care Teams include your primary Cardiologist (physician) and Advanced Practice Providers (APPs -  Physician Assistants and Nurse Practitioners) who all work together to provide you with the care you need, when you need it. . You will need a follow up appointment in 12 months with Dr. Quay Burow.  Please call our office 2 months in advance to schedule this/each appointment.

## 2018-11-28 DIAGNOSIS — Z23 Encounter for immunization: Secondary | ICD-10-CM | POA: Diagnosis not present

## 2019-03-26 DIAGNOSIS — E785 Hyperlipidemia, unspecified: Secondary | ICD-10-CM | POA: Diagnosis not present

## 2019-03-26 DIAGNOSIS — I10 Essential (primary) hypertension: Secondary | ICD-10-CM | POA: Diagnosis not present

## 2019-03-26 DIAGNOSIS — Z0000 Encounter for general adult medical examination without abnormal findings: Secondary | ICD-10-CM | POA: Diagnosis not present

## 2019-03-26 DIAGNOSIS — I700 Atherosclerosis of aorta: Secondary | ICD-10-CM | POA: Diagnosis not present

## 2019-03-26 DIAGNOSIS — R739 Hyperglycemia, unspecified: Secondary | ICD-10-CM | POA: Diagnosis not present

## 2019-03-26 DIAGNOSIS — E039 Hypothyroidism, unspecified: Secondary | ICD-10-CM | POA: Diagnosis not present

## 2019-03-26 DIAGNOSIS — M109 Gout, unspecified: Secondary | ICD-10-CM | POA: Diagnosis not present

## 2019-03-26 DIAGNOSIS — N400 Enlarged prostate without lower urinary tract symptoms: Secondary | ICD-10-CM | POA: Diagnosis not present

## 2019-04-12 ENCOUNTER — Ambulatory Visit: Payer: Medicare PPO

## 2019-04-13 DIAGNOSIS — E039 Hypothyroidism, unspecified: Secondary | ICD-10-CM | POA: Diagnosis not present

## 2019-04-13 DIAGNOSIS — E785 Hyperlipidemia, unspecified: Secondary | ICD-10-CM | POA: Diagnosis not present

## 2019-04-13 DIAGNOSIS — I10 Essential (primary) hypertension: Secondary | ICD-10-CM | POA: Diagnosis not present

## 2019-04-13 DIAGNOSIS — R739 Hyperglycemia, unspecified: Secondary | ICD-10-CM | POA: Diagnosis not present

## 2019-04-17 ENCOUNTER — Ambulatory Visit: Payer: Medicare PPO

## 2019-04-19 ENCOUNTER — Ambulatory Visit: Payer: Medicare PPO | Attending: Internal Medicine

## 2019-04-19 DIAGNOSIS — Z23 Encounter for immunization: Secondary | ICD-10-CM | POA: Insufficient documentation

## 2019-04-19 NOTE — Progress Notes (Signed)
   Covid-19 Vaccination Clinic  Name:  Suliman Termini    MRN: [id number] DOB: 1945/05/04  04/19/2019  Mr. Kinyon was observed post Covid-19 immunization for 15 minutes without incidence. He was provided with Vaccine Information Sheet and instruction to access the V-Safe system.   Mr. Harshfield was instructed to call 911 with any severe reactions post vaccine: Marland Kitchen Difficulty breathing  . Swelling of your face and throat  . A fast heartbeat  . A bad rash all over your body  . Dizziness and weakness    Immunizations Administered    Name Date Dose VIS Date Route   Pfizer COVID-19 Vaccine 04/19/2019 10:23 AM 0.3 mL 02/20/2019 Intramuscular   Manufacturer: [employer], [employer]   Lot: [id number]   NDC: [address]-8

## 2019-04-29 DIAGNOSIS — D225 Melanocytic nevi of trunk: Secondary | ICD-10-CM | POA: Diagnosis not present

## 2019-04-29 DIAGNOSIS — D2371 Other benign neoplasm of skin of right lower limb, including hip: Secondary | ICD-10-CM | POA: Diagnosis not present

## 2019-04-29 DIAGNOSIS — Z85828 Personal history of other malignant neoplasm of skin: Secondary | ICD-10-CM | POA: Diagnosis not present

## 2019-04-29 DIAGNOSIS — Z85820 Personal history of malignant melanoma of skin: Secondary | ICD-10-CM | POA: Diagnosis not present

## 2019-04-29 DIAGNOSIS — L570 Actinic keratosis: Secondary | ICD-10-CM | POA: Diagnosis not present

## 2019-04-29 DIAGNOSIS — L814 Other melanin hyperpigmentation: Secondary | ICD-10-CM | POA: Diagnosis not present

## 2019-04-29 DIAGNOSIS — L821 Other seborrheic keratosis: Secondary | ICD-10-CM | POA: Diagnosis not present

## 2019-05-13 ENCOUNTER — Ambulatory Visit: Payer: Medicare PPO | Attending: Internal Medicine

## 2019-05-13 DIAGNOSIS — Z23 Encounter for immunization: Secondary | ICD-10-CM | POA: Insufficient documentation

## 2019-05-13 NOTE — Progress Notes (Signed)
   Covid-19 Vaccination Clinic  Name:  Vincent Coleman    MRN: [id number] DOB: 02/10/1946  05/13/2019  [name] was observed post Covid-19 immunization for 15 minutes without incident. He was provided with Vaccine Information Sheet and instruction to access the V-Safe system.   [name] was instructed to call 911 with any severe reactions post vaccine: Marland Kitchen Difficulty breathing  . Swelling of face and throat  . A fast heartbeat  . A bad rash all over body  . Dizziness and weakness   Immunizations Administered    Name Date Dose VIS Date Route   Pfizer COVID-19 Vaccine 05/13/2019  8:40 AM 0.3 mL 02/20/2019 Intramuscular   Manufacturer: [employer], [employer]   Lot: [id number]   NDC: [address]-8

## 2019-05-14 DIAGNOSIS — Z86010 Personal history of colonic polyps: Secondary | ICD-10-CM | POA: Diagnosis not present

## 2019-06-05 DIAGNOSIS — Z1159 Encounter for screening for other viral diseases: Secondary | ICD-10-CM | POA: Diagnosis not present

## 2019-06-10 DIAGNOSIS — K5730 Diverticulosis of large intestine without perforation or abscess without bleeding: Secondary | ICD-10-CM | POA: Diagnosis not present

## 2019-06-10 DIAGNOSIS — Z86010 Personal history of colonic polyps: Secondary | ICD-10-CM | POA: Diagnosis not present

## 2019-09-15 IMAGING — DX DG LUMBAR SPINE 2-3V
3 series · 3 of 3 positions shown · non-contrast
Comparison: None.

CLINICAL DATA: Pt c/o continuous low back pain x 1 year. Pt denies
sciatica. No hx of injury/surgery to low back. Hx of sciatica, CKD,
HTN.

EXAM:
LUMBAR SPINE - 2-3 VIEW

[dg lumbar spine 2-3 views (1 of 3)]
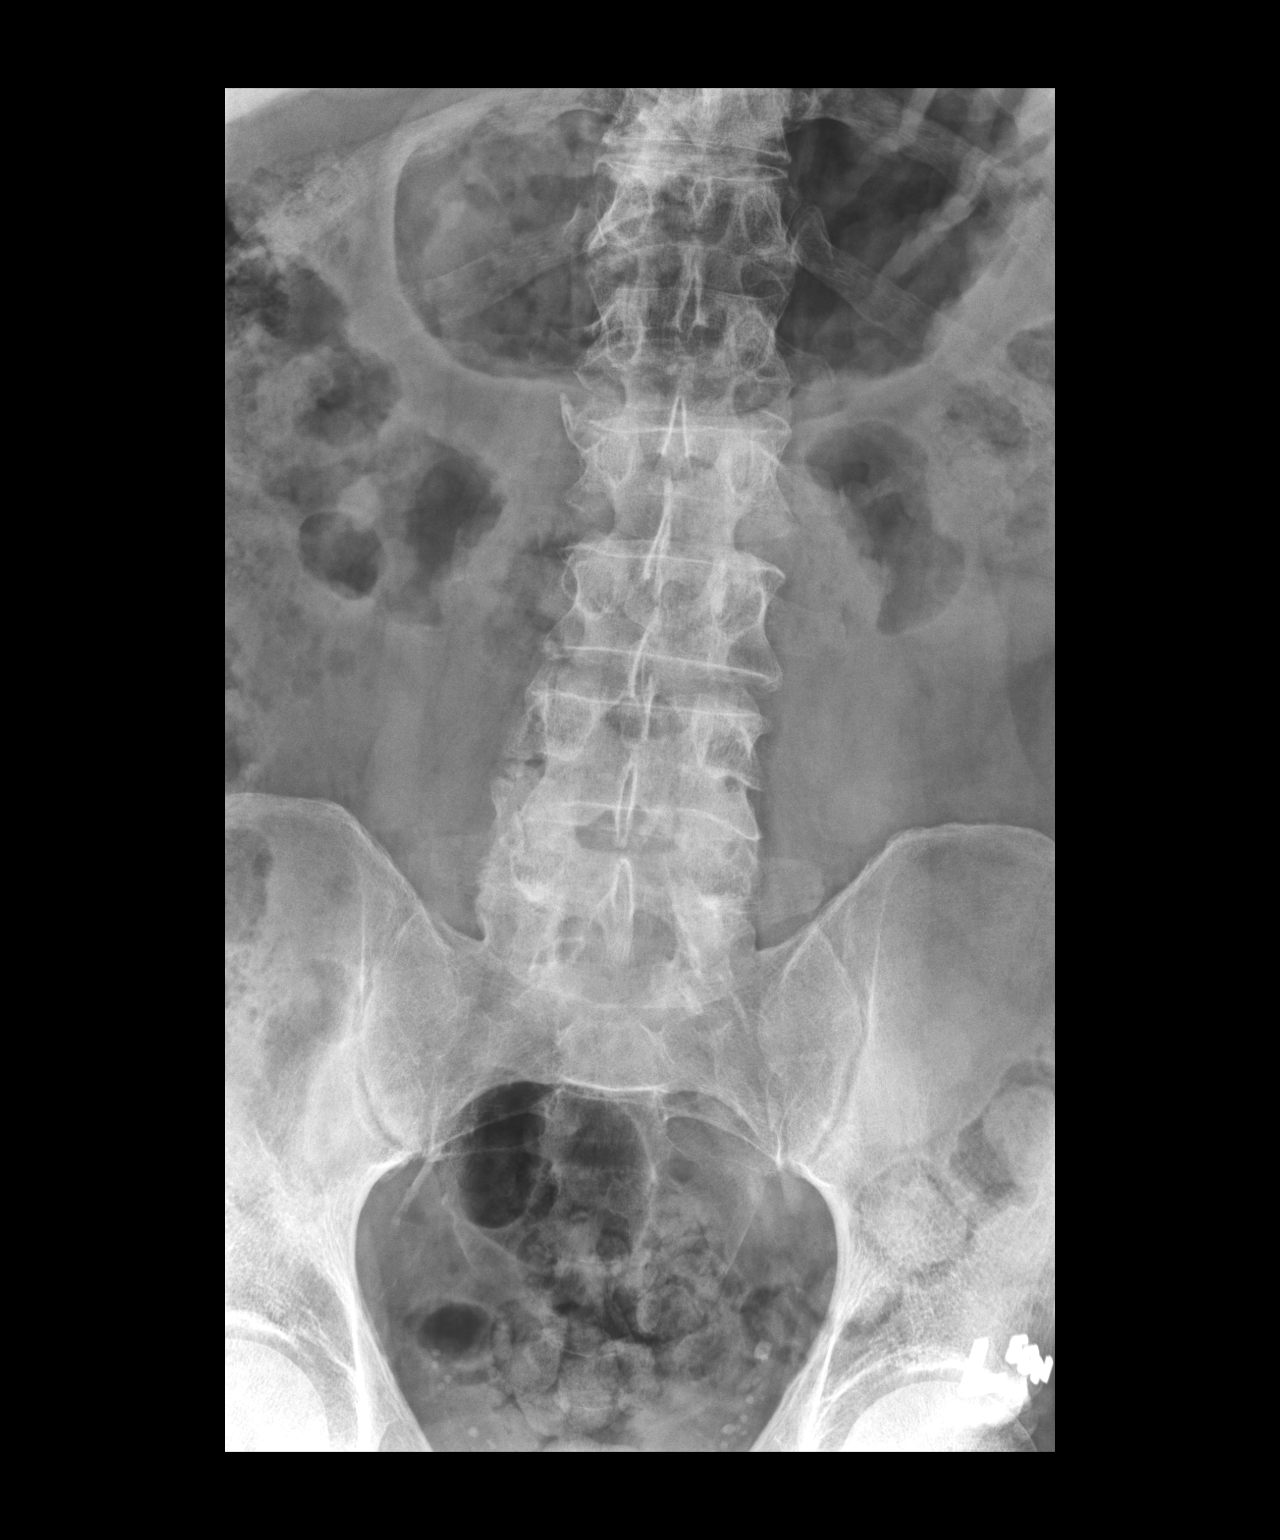

[dg lumbar spine 2-3 views (2 of 3)]
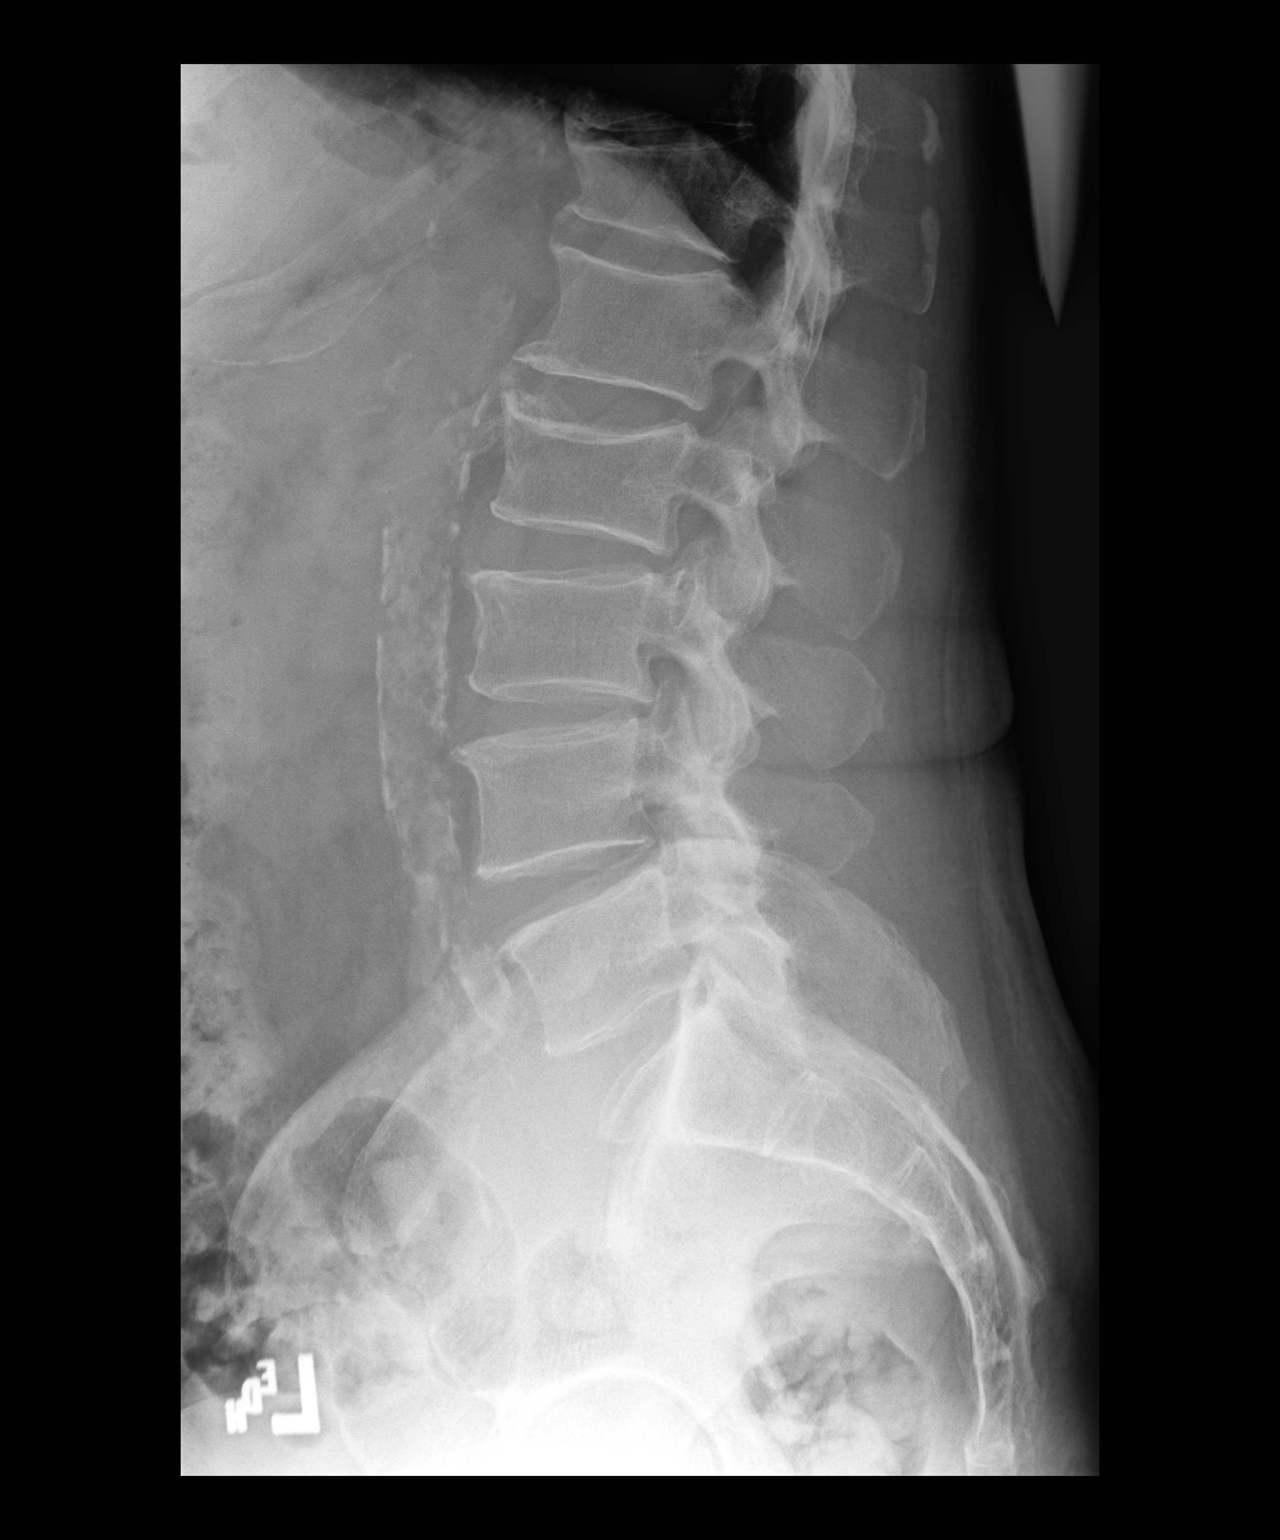

[dg lumbar spine 2-3 views (3 of 3)]
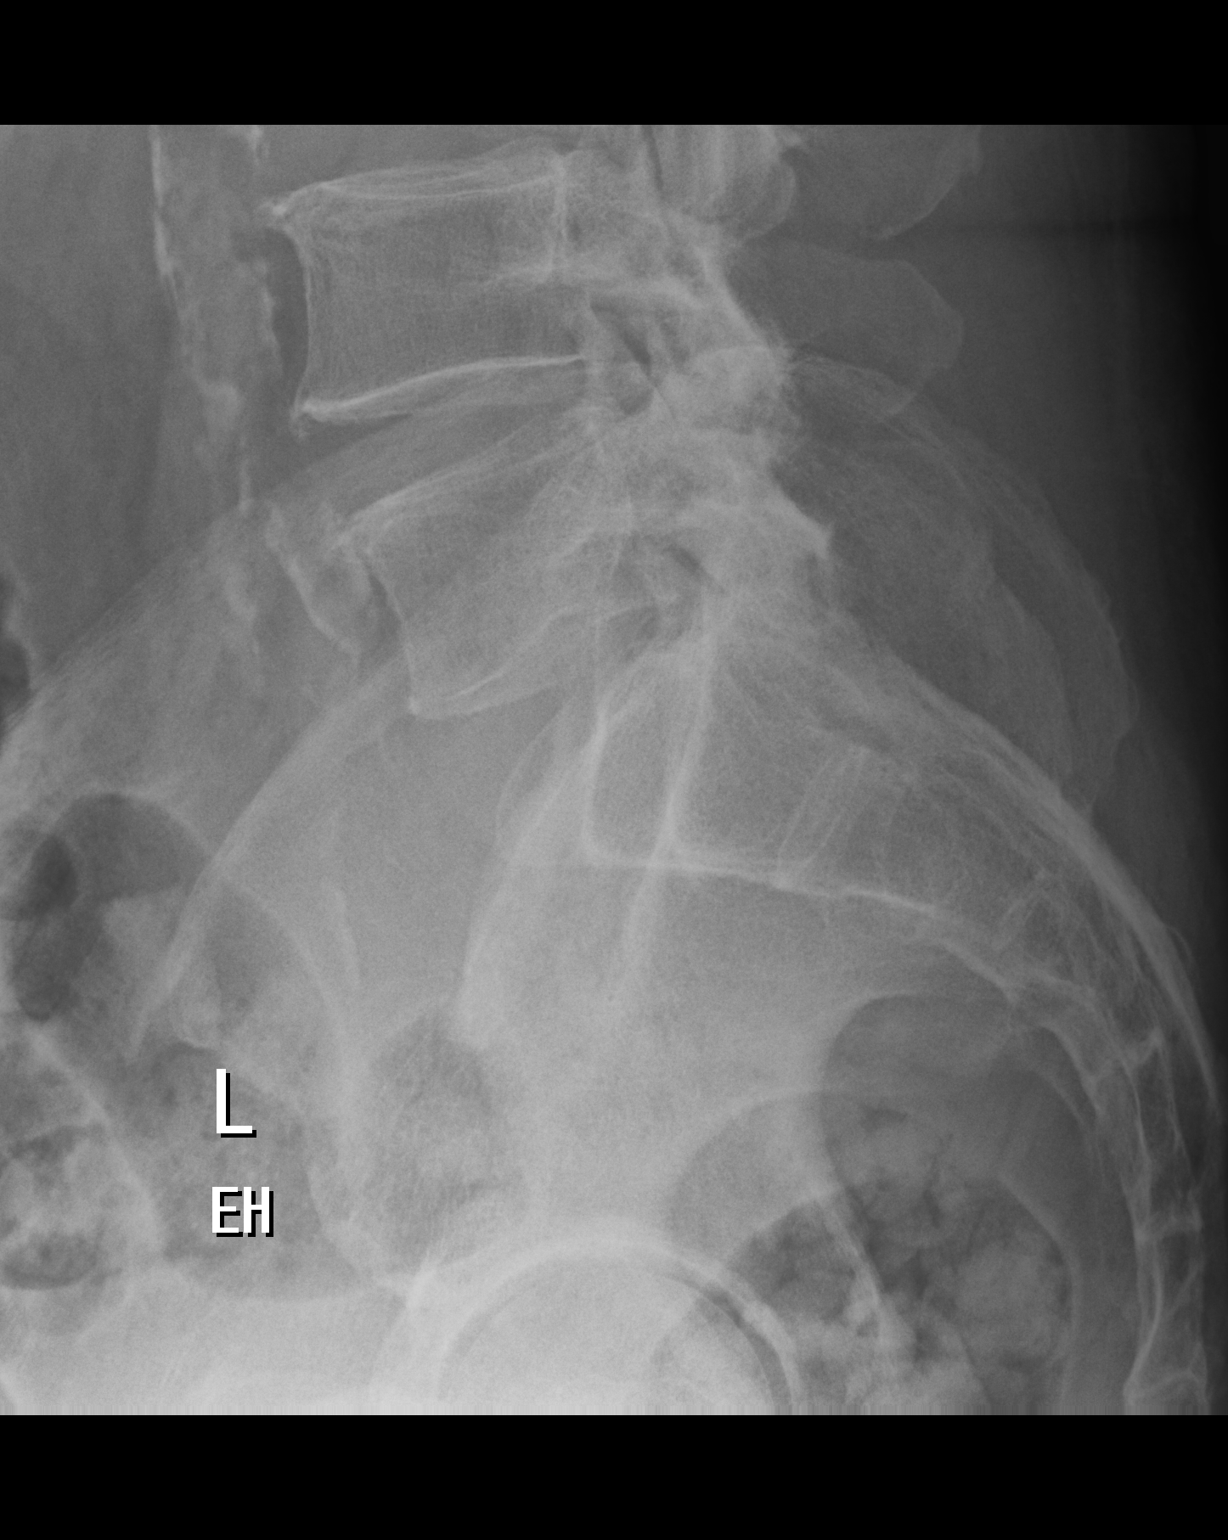

[3 of 3 positions shown; findings below may reference images not displayed]

FINDINGS: There is a compression deformity in the superior endplate of the L2
vertebral body with a central concavity. Minimal loss of vertebral
body height. Endplate osteophytosis from T12 to L2. There is loss of
vertebral body height anteriorly at T12 anteriorly. No subluxation.
No evidence acute fracture.
IMPRESSION: 1. Mild superior endplate deformity of L2 suggest remote trauma.
Minimal loss of disc height.
2. Suspect remote anterior compression fracture at T12. Mild loss
vertebral body height.
3. No acute findings lumbar spine.
4.  Atherosclerotic calcification of the aorta.

## 2019-10-27 ENCOUNTER — Other Ambulatory Visit: Payer: Self-pay

## 2019-10-27 ENCOUNTER — Ambulatory Visit (HOSPITAL_COMMUNITY)
Admission: RE | Admit: 2019-10-27 | Discharge: 2019-10-27 | Disposition: A | Discharge status: Home / Self Care | Payer: Medicare PPO | Source: Ambulatory Visit | Attending: Cardiovascular Disease | Admitting: Cardiovascular Disease

## 2019-10-27 ENCOUNTER — Other Ambulatory Visit: Payer: Self-pay | Admitting: Cardiovascular Disease

## 2019-10-27 DIAGNOSIS — Z85828 Personal history of other malignant neoplasm of skin: Secondary | ICD-10-CM | POA: Diagnosis not present

## 2019-10-27 DIAGNOSIS — I6523 Occlusion and stenosis of bilateral carotid arteries: Secondary | ICD-10-CM

## 2019-10-27 DIAGNOSIS — D1801 Hemangioma of skin and subcutaneous tissue: Secondary | ICD-10-CM | POA: Diagnosis not present

## 2019-10-27 DIAGNOSIS — L814 Other melanin hyperpigmentation: Secondary | ICD-10-CM | POA: Diagnosis not present

## 2019-10-27 DIAGNOSIS — L570 Actinic keratosis: Secondary | ICD-10-CM | POA: Diagnosis not present

## 2019-10-27 DIAGNOSIS — L821 Other seborrheic keratosis: Secondary | ICD-10-CM | POA: Diagnosis not present

## 2019-10-27 DIAGNOSIS — D225 Melanocytic nevi of trunk: Secondary | ICD-10-CM | POA: Diagnosis not present

## 2019-10-27 DIAGNOSIS — D692 Other nonthrombocytopenic purpura: Secondary | ICD-10-CM | POA: Diagnosis not present

## 2019-11-20 ENCOUNTER — Other Ambulatory Visit: Payer: Self-pay

## 2019-11-20 ENCOUNTER — Encounter: Payer: Self-pay | Admitting: Urology

## 2019-11-20 ENCOUNTER — Telehealth: Payer: Self-pay | Admitting: Urology

## 2019-11-20 ENCOUNTER — Ambulatory Visit (INDEPENDENT_AMBULATORY_CARE_PROVIDER_SITE_OTHER): Payer: Medicare PPO | Admitting: Urology

## 2019-11-20 VITALS — BP 115/68 | HR 80 | Temp 97.5°F | Ht 71.0 in | Wt 217.0 lb

## 2019-11-20 DIAGNOSIS — R3915 Urgency of urination: Secondary | ICD-10-CM | POA: Insufficient documentation

## 2019-11-20 DIAGNOSIS — N3941 Urge incontinence: Secondary | ICD-10-CM

## 2019-11-20 DIAGNOSIS — N401 Enlarged prostate with lower urinary tract symptoms: Secondary | ICD-10-CM | POA: Diagnosis not present

## 2019-11-20 LAB — POCT URINALYSIS DIPSTICK
Bilirubin, UA: NEGATIVE
Blood, UA: NEGATIVE
Glucose, UA: NEGATIVE
Ketones, UA: NEGATIVE
Leukocytes, UA: NEGATIVE
Nitrite, UA: NEGATIVE
Protein, UA: NEGATIVE
Spec Grav, UA: 1.025 (ref 1.010–1.025)
Urobilinogen, UA: 0.2 E.U./dL
pH, UA: 6 (ref 5.0–8.0)

## 2019-11-20 MED ORDER — TAMSULOSIN HCL 0.4 MG PO CAPS: 0.4000 mg | ORAL_CAPSULE | Freq: Every day | ORAL | 3 refills | Status: DC

## 2019-11-20 NOTE — Patient Instructions (Signed)

## 2019-11-20 NOTE — Progress Notes (Signed)
11/20/2019 9:05 AM   Vincent Coleman [birth year] [id number]  Referring provider: Shirlean Mylar, MD [address] [place],  [place] [address]  Urinary urgency  HPI: Mr Vincent Coleman is a 74yo here for followup for BPH with LUTS and urinary urgency.  He is currently on flomax 0.4mg  daily. IPSS 12. Stream good. He urinary frequency in the morning which resolves after several voids. Urgency is mild. NO recent PSA. Last PSA was 3.74.   His records from [facility] are as follows: I have symptoms of an enlarged prostate.  HPI: Vincent Coleman is a 74 year-old male established patient who is here for symptoms of enlarged prostate.  He first noticed the symptoms approximately 11/10/2012. His symptoms have gotten worse over the last year. He has been treated with Flomax. The patient has never had a surgical procedure for bladder outlet obstruction to his prostate.   He usually gets up at night to urinate 2 times. He does not have to strain or bear down to start his urinary stream. He does have a good size and strength to his urinary stream. He does not dribble at the end of urination. He is not having problems with emptying his bladder well.   His urine has not shut off completely. He has not previously had an indwelling catheter in for more than two weeks at a time.   He has not had a PSA done. He does not have any family members who have been diagnosed with prostate cancer.   11/15/2016: Over the past 4 years he has noted worsening LUTS. He was admitted in June 2018 with sepsis from a UTI. He has associated urgency, frequency and nocturia.   01/03/2017: He notes improvement in his nocturia on uroxatral but he had severe constipation.   05/20/2017: Nocturia improved to 1-2x on flomax. PVR is 43cc   11/22/2017: He is doing well on flomax. Nocturia 1-2x. He continues to have urgency which on occasion is bothersome   11/21/2018: PSa 3.7. He is on flomax 0.4mg  daily. Nocturia 1-2x. Rare urgency and  rare urge incontinence     CC: I have urinary urgency.  HPI: He does have urgency. He does have problems getting to the bathroom in time after he has the urge to urinate. The condition started approximately 11/10/2013. His symptoms have gotten worse over the last year.   He does not wear protective pads. He generally urinates every 2 hours in the daytime. He gets up at night to urinate 2 times. He is not having problems with emptying his bladder well.   11/22/2017: He continue to have urgency and occasional urge incontinence. He is not bothered by the incontinence   11/21/2018: His urgency and incontiennce has improved since alst visit with flomax 0.4mg  daily     PMH: Past Medical History:  Diagnosis Date  . CKD (chronic kidney disease)   . DOE (dyspnea on exertion)   . Dysuria   . Emphysema of lung (HCC)   . History of throat cancer   . Hyperlipidemia   . Hypertension     Surgical History: No past surgical history on file.  Home Medications:  Allergies as of 11/20/2019   No Known Allergies     Medication List       Accurate as of November 20, 2019  9:05 AM. If you have any questions, ask your nurse or doctor.        allopurinol 300 MG tablet Commonly known as: ZYLOPRIM Take 300 mg by  mouth daily.   levothyroxine 137 MCG tablet Commonly known as: SYNTHROID Take 137 mcg by mouth daily.   lisinopril-hydrochlorothiazide 20-12.5 MG tablet Commonly known as: ZESTORETIC Take 1 tablet by mouth daily.   meloxicam 7.5 MG tablet Commonly known as: MOBIC Take 7.5 mg by mouth daily.   meloxicam 15 MG tablet Commonly known as: MOBIC   omeprazole 20 MG capsule Commonly known as: PRILOSEC Take 20 mg by mouth 2 (two) times daily.   rosuvastatin 40 MG tablet Commonly known as: CRESTOR Take 40 mg by mouth daily.   tamsulosin 0.4 MG Caps capsule Commonly known as: Flomax Take 1 capsule (0.4 mg total) by mouth daily after breakfast.   Ventolin HFA 108 (90 Base) MCG/ACT  inhaler Generic drug: albuterol Take 2 puffs by mouth every 6 (six) hours.       Allergies: No Known Allergies  Family History: Family History  Problem Relation Age of Onset  . Cancer Father     Social History:  reports that he has quit smoking. He has never used smokeless tobacco. He reports that he does not drink alcohol and does not use drugs.  ROS: All other review of systems were reviewed and are negative except what is noted above in HPI  Physical Exam: BP 115/68   Pulse 80   Temp (!) 97.5 F (36.4 C)   Ht 5\' 11"  (1.803 m)   Wt 217 lb (98.4 kg)   BMI 30.27 kg/m   Constitutional:  Alert and oriented, No acute distress. HEENT: [place] AT, moist mucus membranes.  Trachea midline, no masses. Cardiovascular: No clubbing, cyanosis, or edema. Respiratory: Normal respiratory effort, no increased work of breathing. GI: Abdomen is soft, nontender, nondistended, no abdominal masses GU: No CVA tenderness. Circumcised phallus. No masses/lesions on penis, testis, scrotum. Prostate 40g smooth no nodules no induration.  Lymph: No cervical or inguinal lymphadenopathy. Skin: No rashes, bruises or suspicious lesions. Neurologic: Grossly intact, no focal deficits, moving all 4 extremities. Psychiatric: Normal mood and affect.  Laboratory Data: Lab Results  Component Value Date   WBC 4.8 04/25/2016   HGB 12.5 (L) 04/25/2016   HCT 35.0 (L) 04/25/2016   MCV 86.8 04/25/2016   PLT 110 (L) 04/25/2016    Lab Results  Component Value Date   CREATININE 0.88 04/25/2016    No results found for: PSA  No results found for: TESTOSTERONE  No results found for: HGBA1C  Urinalysis    Component Value Date/Time   COLORURINE YELLOW 04/22/2016 2339   APPEARANCEUR HAZY (A) 04/22/2016 2339   LABSPEC 1.023 04/22/2016 2339   PHURINE 5.0 04/22/2016 2339   GLUCOSEU NEGATIVE 04/22/2016 2339   HGBUR MODERATE (A) 04/22/2016 2339   BILIRUBINUR negative 11/20/2019 0859   KETONESUR 5 (A)  04/22/2016 2339   PROTEINUR Negative 11/20/2019 0859   PROTEINUR 30 (A) 04/22/2016 2339   UROBILINOGEN 0.2 11/20/2019 0859   NITRITE negative 11/20/2019 0859   NITRITE POSITIVE (A) 04/22/2016 2339   LEUKOCYTESUR Negative 11/20/2019 0859    Lab Results  Component Value Date   BACTERIA RARE (A) 04/22/2016    Pertinent Imaging:  No results found for this or any previous visit.  No results found for this or any previous visit.  No results found for this or any previous visit.  No results found for this or any previous visit.  Results for orders placed during the hospital encounter of 05/31/16  06/02/16 Renal  Narrative CLINICAL DATA:  Stage III chronic kidney disease, history hypertension, throat  cancer, former smoker  EXAM: RENAL / URINARY TRACT ULTRASOUND COMPLETE  COMPARISON:  None  FINDINGS: Right Kidney:  Length: 11.6 cm. Mild age-related cortical thinning. Normal cortical echogenicity. No mass, hydronephrosis or shadowing calcification.  Left Kidney:  Length: 11.6 cm. Normal cortical echogenicity. Mild age-related renal cortical thinning. Small cystic lesion at upper pole 14 x 8 x 10 mm. No additional renal mass, hydronephrosis or shadowing calcification.  Bladder:  Normal appearance. BILATERAL ureteral jets visualized. Prostate gland appears enlarged 4.6 cm transverse by 2.8 cm AP, length inadequately establish but likely at least 4.4 cm.  IMPRESSION: Small LEFT renal cyst.  Age-related renal cortical thinning.  Prostatic enlargement.   Electronically Signed By: Ulyses Southward M.D. On: 05/31/2016 16:23  No results found for this or any previous visit.  No results found for this or any previous visit.  No results found for this or any previous visit.   Assessment & Plan:    1. Benign localized prostatic hyperplasia with lower urinary tract symptoms (LUTS) -Continue flomax 0.4mg  daily  - POCT urinalysis dipstick  2. Urinary urgency -continue  flomax 0.4mg  daily  3. Urge incontinence  -resolved  PSA today, will call with results   No follow-ups on file.  Wilkie Aye, MD  [facility] [place]

## 2019-11-20 NOTE — Progress Notes (Signed)
Urological Symptom Review  Patient is experiencing the following symptoms: Hard to postpone urination   Review of Systems  Gastrointestinal (upper)  : Negative for upper GI symptoms  Gastrointestinal (lower) : Diarrhea  Constitutional : Negative for symptoms  Skin: Negative for skin symptoms  Eyes: Negative for eye symptoms  Ear/Nose/Throat : Negative for Ear/Nose/Throat symptoms  Hematologic/Lymphatic: Negative for Hematologic/Lymphatic symptoms  Cardiovascular : Negative for cardiovascular symptoms  Respiratory : Negative for respiratory symptoms  Endocrine: Excessive thirst  Musculoskeletal: Negative for musculoskeletal symptoms  Neurological: Negative for neurological symptoms  Psychologic: Negative for psychiatric symptoms

## 2019-11-20 NOTE — Telephone Encounter (Signed)
completed

## 2019-11-20 NOTE — Telephone Encounter (Signed)
Patient would like the prescription that was ordered today to be send to the [facility] pharmacy.  Please change.

## 2019-11-26 DIAGNOSIS — Z23 Encounter for immunization: Secondary | ICD-10-CM | POA: Diagnosis not present

## 2019-11-26 DIAGNOSIS — N401 Enlarged prostate with lower urinary tract symptoms: Secondary | ICD-10-CM | POA: Diagnosis not present

## 2019-12-11 DIAGNOSIS — N2581 Secondary hyperparathyroidism of renal origin: Secondary | ICD-10-CM | POA: Diagnosis not present

## 2019-12-11 DIAGNOSIS — I129 Hypertensive chronic kidney disease with stage 1 through stage 4 chronic kidney disease, or unspecified chronic kidney disease: Secondary | ICD-10-CM | POA: Diagnosis not present

## 2019-12-11 DIAGNOSIS — N1831 Chronic kidney disease, stage 3a: Secondary | ICD-10-CM | POA: Diagnosis not present

## 2019-12-11 DIAGNOSIS — E785 Hyperlipidemia, unspecified: Secondary | ICD-10-CM | POA: Diagnosis not present

## 2019-12-11 DIAGNOSIS — M109 Gout, unspecified: Secondary | ICD-10-CM | POA: Diagnosis not present

## 2019-12-30 DIAGNOSIS — N1831 Chronic kidney disease, stage 3a: Secondary | ICD-10-CM | POA: Diagnosis not present

## 2020-03-31 DIAGNOSIS — N400 Enlarged prostate without lower urinary tract symptoms: Secondary | ICD-10-CM | POA: Diagnosis not present

## 2020-03-31 DIAGNOSIS — M109 Gout, unspecified: Secondary | ICD-10-CM | POA: Diagnosis not present

## 2020-03-31 DIAGNOSIS — E785 Hyperlipidemia, unspecified: Secondary | ICD-10-CM | POA: Diagnosis not present

## 2020-03-31 DIAGNOSIS — I10 Essential (primary) hypertension: Secondary | ICD-10-CM | POA: Diagnosis not present

## 2020-03-31 DIAGNOSIS — I700 Atherosclerosis of aorta: Secondary | ICD-10-CM | POA: Diagnosis not present

## 2020-03-31 DIAGNOSIS — E039 Hypothyroidism, unspecified: Secondary | ICD-10-CM | POA: Diagnosis not present

## 2020-03-31 DIAGNOSIS — Z0000 Encounter for general adult medical examination without abnormal findings: Secondary | ICD-10-CM | POA: Diagnosis not present

## 2020-04-26 DIAGNOSIS — L570 Actinic keratosis: Secondary | ICD-10-CM | POA: Diagnosis not present

## 2020-04-26 DIAGNOSIS — C44519 Basal cell carcinoma of skin of other part of trunk: Secondary | ICD-10-CM | POA: Diagnosis not present

## 2020-04-26 DIAGNOSIS — L821 Other seborrheic keratosis: Secondary | ICD-10-CM | POA: Diagnosis not present

## 2020-04-26 DIAGNOSIS — D225 Melanocytic nevi of trunk: Secondary | ICD-10-CM | POA: Diagnosis not present

## 2020-04-26 DIAGNOSIS — L814 Other melanin hyperpigmentation: Secondary | ICD-10-CM | POA: Diagnosis not present

## 2020-04-26 DIAGNOSIS — Z85828 Personal history of other malignant neoplasm of skin: Secondary | ICD-10-CM | POA: Diagnosis not present

## 2020-06-14 DIAGNOSIS — I10 Essential (primary) hypertension: Secondary | ICD-10-CM | POA: Diagnosis not present

## 2020-07-14 DIAGNOSIS — H35371 Puckering of macula, right eye: Secondary | ICD-10-CM | POA: Diagnosis not present

## 2020-07-27 DIAGNOSIS — H35371 Puckering of macula, right eye: Secondary | ICD-10-CM | POA: Diagnosis not present

## 2020-07-27 DIAGNOSIS — H2513 Age-related nuclear cataract, bilateral: Secondary | ICD-10-CM | POA: Diagnosis not present

## 2020-07-27 DIAGNOSIS — H43813 Vitreous degeneration, bilateral: Secondary | ICD-10-CM | POA: Diagnosis not present

## 2020-08-04 DIAGNOSIS — H3581 Retinal edema: Secondary | ICD-10-CM | POA: Diagnosis not present

## 2020-08-04 DIAGNOSIS — H35371 Puckering of macula, right eye: Secondary | ICD-10-CM | POA: Diagnosis not present

## 2020-08-17 DIAGNOSIS — H35371 Puckering of macula, right eye: Secondary | ICD-10-CM | POA: Diagnosis not present

## 2020-08-17 DIAGNOSIS — H3581 Retinal edema: Secondary | ICD-10-CM | POA: Diagnosis not present

## 2020-09-14 DIAGNOSIS — H3581 Retinal edema: Secondary | ICD-10-CM | POA: Diagnosis not present

## 2020-09-14 DIAGNOSIS — H35371 Puckering of macula, right eye: Secondary | ICD-10-CM | POA: Diagnosis not present

## 2020-10-24 DIAGNOSIS — L570 Actinic keratosis: Secondary | ICD-10-CM | POA: Diagnosis not present

## 2020-10-24 DIAGNOSIS — L814 Other melanin hyperpigmentation: Secondary | ICD-10-CM | POA: Diagnosis not present

## 2020-10-24 DIAGNOSIS — Z85828 Personal history of other malignant neoplasm of skin: Secondary | ICD-10-CM | POA: Diagnosis not present

## 2020-10-24 DIAGNOSIS — L821 Other seborrheic keratosis: Secondary | ICD-10-CM | POA: Diagnosis not present

## 2020-10-24 DIAGNOSIS — D225 Melanocytic nevi of trunk: Secondary | ICD-10-CM | POA: Diagnosis not present

## 2020-10-24 DIAGNOSIS — D692 Other nonthrombocytopenic purpura: Secondary | ICD-10-CM | POA: Diagnosis not present

## 2020-10-24 DIAGNOSIS — D1801 Hemangioma of skin and subcutaneous tissue: Secondary | ICD-10-CM | POA: Diagnosis not present

## 2020-10-26 ENCOUNTER — Ambulatory Visit (HOSPITAL_COMMUNITY)
Admission: RE | Admit: 2020-10-26 | Discharge: 2020-10-26 | Disposition: A | Discharge status: Home / Self Care | Payer: Medicare PPO | Source: Ambulatory Visit | Attending: Cardiology | Admitting: Cardiology

## 2020-10-26 ENCOUNTER — Other Ambulatory Visit: Payer: Self-pay

## 2020-10-26 ENCOUNTER — Other Ambulatory Visit (HOSPITAL_COMMUNITY): Payer: Self-pay | Admitting: Cardiovascular Disease

## 2020-10-26 DIAGNOSIS — I6523 Occlusion and stenosis of bilateral carotid arteries: Secondary | ICD-10-CM | POA: Insufficient documentation

## 2020-11-03 ENCOUNTER — Other Ambulatory Visit: Payer: Self-pay

## 2020-11-03 DIAGNOSIS — N401 Enlarged prostate with lower urinary tract symptoms: Secondary | ICD-10-CM

## 2020-11-11 ENCOUNTER — Other Ambulatory Visit: Payer: Medicare PPO

## 2020-11-15 DIAGNOSIS — Z23 Encounter for immunization: Secondary | ICD-10-CM | POA: Diagnosis not present

## 2020-11-18 ENCOUNTER — Ambulatory Visit: Payer: Medicare PPO | Admitting: Urology

## 2020-12-03 DIAGNOSIS — S3991XA Unspecified injury of abdomen, initial encounter: Secondary | ICD-10-CM | POA: Diagnosis not present

## 2020-12-03 DIAGNOSIS — S022XXA Fracture of nasal bones, initial encounter for closed fracture: Secondary | ICD-10-CM | POA: Diagnosis not present

## 2020-12-03 DIAGNOSIS — S0101XA Laceration without foreign body of scalp, initial encounter: Secondary | ICD-10-CM | POA: Diagnosis not present

## 2020-12-03 DIAGNOSIS — S080XXA Avulsion of scalp, initial encounter: Secondary | ICD-10-CM | POA: Diagnosis not present

## 2020-12-03 DIAGNOSIS — W109XXA Fall (on) (from) unspecified stairs and steps, initial encounter: Secondary | ICD-10-CM | POA: Diagnosis not present

## 2020-12-03 DIAGNOSIS — I10 Essential (primary) hypertension: Secondary | ICD-10-CM | POA: Diagnosis not present

## 2020-12-03 DIAGNOSIS — Z20822 Contact with and (suspected) exposure to covid-19: Secondary | ICD-10-CM | POA: Diagnosis not present

## 2020-12-03 DIAGNOSIS — F10129 Alcohol abuse with intoxication, unspecified: Secondary | ICD-10-CM | POA: Diagnosis not present

## 2020-12-03 DIAGNOSIS — S199XXA Unspecified injury of neck, initial encounter: Secondary | ICD-10-CM | POA: Diagnosis not present

## 2020-12-03 DIAGNOSIS — S0083XA Contusion of other part of head, initial encounter: Secondary | ICD-10-CM | POA: Diagnosis not present

## 2020-12-03 DIAGNOSIS — S80212A Abrasion, left knee, initial encounter: Secondary | ICD-10-CM | POA: Diagnosis not present

## 2020-12-03 DIAGNOSIS — S0990XA Unspecified injury of head, initial encounter: Secondary | ICD-10-CM | POA: Diagnosis not present

## 2020-12-03 DIAGNOSIS — S80211A Abrasion, right knee, initial encounter: Secondary | ICD-10-CM | POA: Diagnosis not present

## 2020-12-03 DIAGNOSIS — S299XXA Unspecified injury of thorax, initial encounter: Secondary | ICD-10-CM | POA: Diagnosis not present

## 2020-12-03 DIAGNOSIS — I959 Hypotension, unspecified: Secondary | ICD-10-CM | POA: Diagnosis not present

## 2020-12-03 DIAGNOSIS — S0181XA Laceration without foreign body of other part of head, initial encounter: Secondary | ICD-10-CM | POA: Diagnosis not present

## 2020-12-08 DIAGNOSIS — N2581 Secondary hyperparathyroidism of renal origin: Secondary | ICD-10-CM | POA: Diagnosis not present

## 2020-12-08 DIAGNOSIS — N401 Enlarged prostate with lower urinary tract symptoms: Secondary | ICD-10-CM | POA: Diagnosis not present

## 2020-12-08 DIAGNOSIS — N1831 Chronic kidney disease, stage 3a: Secondary | ICD-10-CM | POA: Diagnosis not present

## 2020-12-08 DIAGNOSIS — E559 Vitamin D deficiency, unspecified: Secondary | ICD-10-CM | POA: Diagnosis not present

## 2020-12-09 LAB — PSA: Prostate Specific Ag, Serum: 4.6 ng/mL — ABNORMAL HIGH (ref 0.0–4.0)

## 2020-12-12 DIAGNOSIS — R911 Solitary pulmonary nodule: Secondary | ICD-10-CM | POA: Diagnosis not present

## 2020-12-12 DIAGNOSIS — Z4802 Encounter for removal of sutures: Secondary | ICD-10-CM | POA: Diagnosis not present

## 2020-12-12 DIAGNOSIS — S022XXD Fracture of nasal bones, subsequent encounter for fracture with routine healing: Secondary | ICD-10-CM | POA: Diagnosis not present

## 2020-12-14 DIAGNOSIS — N1831 Chronic kidney disease, stage 3a: Secondary | ICD-10-CM | POA: Diagnosis not present

## 2020-12-14 DIAGNOSIS — I129 Hypertensive chronic kidney disease with stage 1 through stage 4 chronic kidney disease, or unspecified chronic kidney disease: Secondary | ICD-10-CM | POA: Diagnosis not present

## 2020-12-14 DIAGNOSIS — D61818 Other pancytopenia: Secondary | ICD-10-CM | POA: Diagnosis not present

## 2020-12-14 DIAGNOSIS — N2581 Secondary hyperparathyroidism of renal origin: Secondary | ICD-10-CM | POA: Diagnosis not present

## 2020-12-14 DIAGNOSIS — E785 Hyperlipidemia, unspecified: Secondary | ICD-10-CM | POA: Diagnosis not present

## 2020-12-14 DIAGNOSIS — M109 Gout, unspecified: Secondary | ICD-10-CM | POA: Diagnosis not present

## 2020-12-20 DIAGNOSIS — N1831 Chronic kidney disease, stage 3a: Secondary | ICD-10-CM | POA: Diagnosis not present

## 2020-12-23 ENCOUNTER — Telehealth: Payer: Self-pay | Admitting: Oncology

## 2020-12-23 NOTE — Telephone Encounter (Signed)
Called patient per new patient referral . Patient is aware of lab appt on 11/18 and New patient appointment with Dr. Truett Perna on 01/30/21. He is aware of location and will call back if he has any questions.

## 2020-12-30 ENCOUNTER — Other Ambulatory Visit: Payer: Self-pay

## 2020-12-30 ENCOUNTER — Encounter: Payer: Self-pay | Admitting: Urology

## 2020-12-30 ENCOUNTER — Ambulatory Visit: Payer: Medicare PPO | Admitting: Urology

## 2020-12-30 VITALS — BP 173/79 | HR 88 | Temp 98.3°F | Wt 200.0 lb

## 2020-12-30 DIAGNOSIS — N401 Enlarged prostate with lower urinary tract symptoms: Secondary | ICD-10-CM

## 2020-12-30 DIAGNOSIS — R972 Elevated prostate specific antigen [PSA]: Secondary | ICD-10-CM | POA: Diagnosis not present

## 2020-12-30 DIAGNOSIS — R3915 Urgency of urination: Secondary | ICD-10-CM

## 2020-12-30 LAB — MICROSCOPIC EXAMINATION
Bacteria, UA: NONE SEEN
Renal Epithel, UA: NONE SEEN /hpf
WBC, UA: NONE SEEN /hpf (ref 0–5)

## 2020-12-30 LAB — URINALYSIS, ROUTINE W REFLEX MICROSCOPIC
Bilirubin, UA: NEGATIVE
Ketones, UA: NEGATIVE
Leukocytes,UA: NEGATIVE
Nitrite, UA: NEGATIVE
Specific Gravity, UA: 1.02 (ref 1.005–1.030)
Urobilinogen, Ur: 0.2 mg/dL (ref 0.2–1.0)
pH, UA: 5.5 (ref 5.0–7.5)

## 2020-12-30 MED ORDER — TAMSULOSIN HCL 0.4 MG PO CAPS: 0.4000 mg | ORAL_CAPSULE | Freq: Every day | ORAL | 3 refills | Status: DC

## 2020-12-30 NOTE — Progress Notes (Signed)
12/30/2020 11:04 AM   Vincent Coleman [birth year] [id number]  Referring provider: Shirlean Mylar, MD [address] [place],  [place] [address]  Followup BPH   HPI: Vincent Coleman is a 75yo here for followup for BPH with urinary urgency. He is on flomax 0.4mg  daily. IPSS 8 QOL 1. Urine stream strong. Nocturia 1x. Urinary urgency is rare. PSA increased to 4.6 from 3.7 last year. No other complaints today   PMH: Past Medical History:  Diagnosis Date   CKD (chronic kidney disease)    DOE (dyspnea on exertion)    Dysuria    Emphysema of lung (HCC)    History of throat cancer    Hyperlipidemia    Hypertension     Surgical History: No past surgical history on file.  Home Medications:  Allergies as of 12/30/2020   No Known Allergies      Medication List        Accurate as of December 30, 2020 11:04 AM. If you have any questions, ask your nurse or doctor.          allopurinol 300 MG tablet Commonly known as: ZYLOPRIM Take 300 mg by mouth daily.   levothyroxine 137 MCG tablet Commonly known as: SYNTHROID Take 137 mcg by mouth daily.   lisinopril-hydrochlorothiazide 20-12.5 MG tablet Commonly known as: ZESTORETIC Take 1 tablet by mouth daily.   meloxicam 7.5 MG tablet Commonly known as: MOBIC Take 7.5 mg by mouth daily.   meloxicam 15 MG tablet Commonly known as: MOBIC   omeprazole 20 MG capsule Commonly known as: PRILOSEC Take 20 mg by mouth 2 (two) times daily.   rosuvastatin 40 MG tablet Commonly known as: CRESTOR Take 40 mg by mouth daily.   tamsulosin 0.4 MG Caps capsule Commonly known as: Flomax Take 1 capsule (0.4 mg total) by mouth daily after breakfast.   Ventolin HFA 108 (90 Base) MCG/ACT inhaler Generic drug: albuterol Take 2 puffs by mouth every 6 (six) hours.        Allergies: No Known Allergies  Family History: Family History  Problem Relation Age of Onset   Cancer Father     Social History:  reports that he has  quit smoking. He has never used smokeless tobacco. He reports that he does not drink alcohol and does not use drugs.  ROS: All other review of systems were reviewed and are negative except what is noted above in HPI  Physical Exam: BP (!) 173/79   Pulse 88   Temp 98.3 F (36.8 C)   Wt 200 lb (90.7 kg)   BMI 27.89 kg/m   Constitutional:  Alert and oriented, No acute distress. HEENT: [place] AT, moist mucus membranes.  Trachea midline, no masses. Cardiovascular: No clubbing, cyanosis, or edema. Respiratory: Normal respiratory effort, no increased work of breathing. GI: Abdomen is soft, nontender, nondistended, no abdominal masses GU: No CVA tenderness.  Lymph: No cervical or inguinal lymphadenopathy. Skin: No rashes, bruises or suspicious lesions. Neurologic: Grossly intact, no focal deficits, moving all 4 extremities. Psychiatric: Normal mood and affect.  Laboratory Data: Lab Results  Component Value Date   WBC 4.8 04/25/2016   HGB 12.5 (L) 04/25/2016   HCT 35.0 (L) 04/25/2016   MCV 86.8 04/25/2016   PLT 110 (L) 04/25/2016    Lab Results  Component Value Date   CREATININE 0.88 04/25/2016    No results found for: PSA  No results found for: TESTOSTERONE  No results found for: HGBA1C  Urinalysis  Component Value Date/Time   COLORURINE YELLOW 04/22/2016 2339   APPEARANCEUR HAZY (A) 04/22/2016 2339   LABSPEC 1.023 04/22/2016 2339   PHURINE 5.0 04/22/2016 2339   GLUCOSEU NEGATIVE 04/22/2016 2339   HGBUR MODERATE (A) 04/22/2016 2339   BILIRUBINUR negative 11/20/2019 0859   KETONESUR 5 (A) 04/22/2016 2339   PROTEINUR Negative 11/20/2019 0859   PROTEINUR 30 (A) 04/22/2016 2339   UROBILINOGEN 0.2 11/20/2019 0859   NITRITE negative 11/20/2019 0859   NITRITE POSITIVE (A) 04/22/2016 2339   LEUKOCYTESUR Negative 11/20/2019 0859    Lab Results  Component Value Date   BACTERIA RARE (A) 04/22/2016    Pertinent Imaging:  No results found for this or any previous  visit.  No results found for this or any previous visit.  No results found for this or any previous visit.  No results found for this or any previous visit.  Results for orders placed during the hospital encounter of 05/31/16  US Renal  Narrative CLINICAL DATA:  Stage III chronic kidney disease, history hypertension, throat cancer, former smoker  EXAM: RENAL / URINARY TRACT ULTRASOUND COMPLETE  COMPARISON:  None  FINDINGS: Right Kidney:  Length: 11.6 cm. Mild age-related cortical thinning. Normal cortical echogenicity. No mass, hydronephrosis or shadowing calcification.  Left Kidney:  Length: 11.6 cm. Normal cortical echogenicity. Mild age-related renal cortical thinning. Small cystic lesion at upper pole 14 x 8 x 10 mm. No additional renal mass, hydronephrosis or shadowing calcification.  Bladder:  Normal appearance. BILATERAL ureteral jets visualized. Prostate gland appears enlarged 4.6 cm transverse by 2.8 cm AP, length inadequately establish but likely at least 4.4 cm.  IMPRESSION: Small LEFT renal cyst.  Age-related renal cortical thinning.  Prostatic enlargement.   Electronically Signed By: Vincent Coleman M.D. On: 05/31/2016 16:23  No results found for this or any previous visit.  No results found for this or any previous visit.  No results found for this or any previous visit.   Assessment & Plan:    1. Benign localized prostatic hyperplasia with lower urinary tract symptoms (LUTS) -continue flomax 0.4mg  daily - Urinalysis, Routine w reflex microscopic  2. Urinary urgency -continue flomax 0.4mg  daily  3. Elevated PSA -RTC 1 year with PSA   No follow-ups on file.  Vincent Aye, MD  [facility]

## 2020-12-30 NOTE — Patient Instructions (Signed)
Benign Prostatic Hyperplasia Benign prostatic hyperplasia (BPH) is an enlarged prostate gland that is caused by the normal aging process and not by cancer. The prostate is a walnut-sized gland that is involved in the production of semen. It is located in front of the rectum and below the bladder. The bladder stores urine and the urethra is the tube that carries the urine out of the body. The prostate may get bigger as a man gets older. An enlarged prostate can press on the urethra. This can make it harder to pass urine. The build-up of urine in the bladder can cause infection. Back pressure and infection may progress to bladder damage and kidney (renal) failure. What are the causes? This condition is part of a normal aging process. However, not all men develop problems from this condition. If the prostate enlarges away from the urethra, urine flow will not be blocked. If it enlarges toward the urethra and compresses it, there will be problems passing urine. What increases the risk? This condition is more likely to develop in men over the age of 50 years. What are the signs or symptoms? Symptoms of this condition include: Getting up often during the night to urinate. Needing to urinate frequently during the day. Difficulty starting urine flow. Decrease in size and strength of your urine stream. Leaking (dribbling) after urinating. Inability to pass urine. This needs immediate treatment. Inability to completely empty your bladder. Pain when you pass urine. This is more common if there is also an infection. Urinary tract infection (UTI). How is this diagnosed? This condition is diagnosed based on your medical history, a physical exam, and your symptoms. Tests will also be done, such as: A post-void bladder scan. This measures any amount of urine that may remain in your bladder after you finish urinating. A digital rectal exam. In a rectal exam, your health care provider checks your prostate by  putting a lubricated, gloved finger into your rectum to feel the back of your prostate gland. This exam detects the size of your gland and any abnormal lumps or growths. An exam of your urine (urinalysis). A prostate specific antigen (PSA) screening. This is a blood test used to screen for prostate cancer. An ultrasound. This test uses sound waves to electronically produce a picture of your prostate gland. Your health care provider may refer you to a specialist in kidney and prostate diseases (urologist). How is this treated? Once symptoms begin, your health care provider will monitor your condition (active surveillance or watchful waiting). Treatment for this condition will depend on the severity of your condition. Treatment may include: Observation and yearly exams. This may be the only treatment needed if your condition and symptoms are mild. Medicines to relieve your symptoms, including: Medicines to shrink the prostate. Medicines to relax the muscle of the prostate. Surgery in severe cases. Surgery may include: Prostatectomy. In this procedure, the prostate tissue is removed completely through an open incision or with a laparoscope or robotics. Transurethral resection of the prostate (TURP). In this procedure, a tool is inserted through the opening at the tip of the penis (urethra). It is used to cut away tissue of the inner core of the prostate. The pieces are removed through the same opening of the penis. This removes the blockage. Transurethral incision (TUIP). In this procedure, small cuts are made in the prostate. This lessens the prostate's pressure on the urethra. Transurethral microwave thermotherapy (TUMT). This procedure uses microwaves to create heat. The heat destroys and removes a   small amount of prostate tissue. Transurethral needle ablation (TUNA). This procedure uses radio frequencies to destroy and remove a small amount of prostate tissue. Interstitial laser coagulation (ILC).  This procedure uses a laser to destroy and remove a small amount of prostate tissue. Transurethral electrovaporization (TUVP). This procedure uses electrodes to destroy and remove a small amount of prostate tissue. Prostatic urethral lift. This procedure inserts an implant to push the lobes of the prostate away from the urethra. Follow these instructions at home: Take over-the-counter and prescription medicines only as told by your health care provider. Monitor your symptoms for any changes. Contact your health care provider with any changes. Avoid drinking large amounts of liquid before going to bed or out in public. Avoid or reduce how much caffeine or alcohol you drink. Give yourself time when you urinate. Keep all follow-up visits as told by your health care provider. This is important. Contact a health care provider if: You have unexplained back pain. Your symptoms do not get better with treatment. You develop side effects from the medicine you are taking. Your urine becomes very dark or has a bad smell. Your lower abdomen becomes distended and you have trouble passing your urine. Get help right away if: You have a fever or chills. You suddenly cannot urinate. You feel lightheaded, or very dizzy, or you faint. There are large amounts of blood or clots in the urine. Your urinary problems become hard to manage. You develop moderate to severe low back or flank pain. The flank is the side of your body between the ribs and the hip. These symptoms may represent a serious problem that is an emergency. Do not wait to see if the symptoms will go away. Get medical help right away. Call your local emergency services (911 in the [place] Do not drive yourself to the hospital. Summary Benign prostatic hyperplasia (BPH) is an enlarged prostate that is caused by the normal aging process and not by cancer. An enlarged prostate can press on the urethra. This can make it hard to pass urine. This  condition is part of a normal aging process and is more likely to develop in men over the age of 50 years. Get help right away if you suddenly cannot urinate. This information is not intended to replace advice given to you by your health care provider. Make sure you discuss any questions you have with your health care provider. Document Revised: 06/08/2020 Document Reviewed: 11/05/2019 Elsevier Patient Education  2022 Elsevier Inc.  

## 2020-12-30 NOTE — Progress Notes (Signed)
Urological Symptom Review  Patient is experiencing the following symptoms: Frequent urination Get up at night to urinate Leakage of urine Urinary tract infection Erection problems (male only)   Review of Systems  Gastrointestinal (upper)  : Negative for upper GI symptoms  Gastrointestinal (lower) : Constipation  Constitutional : Negative for symptoms  Skin: Negative for skin symptoms  Eyes: Negative for eye symptoms  Ear/Nose/Throat : Negative for Ear/Nose/Throat symptoms  Hematologic/Lymphatic: Negative for Hematologic/Lymphatic symptoms  Cardiovascular : Negative for cardiovascular symptoms  Respiratory : Shortness of breath  Endocrine: Negative for endocrine symptoms  Musculoskeletal: Negative for musculoskeletal symptoms  Neurological: Negative for neurological symptoms  Psychologic: Negative for psychiatric symptoms

## 2021-01-17 ENCOUNTER — Other Ambulatory Visit: Payer: Self-pay | Admitting: *Deleted

## 2021-01-17 DIAGNOSIS — D649 Anemia, unspecified: Secondary | ICD-10-CM

## 2021-01-17 NOTE — Progress Notes (Signed)
Lab orders entered for 11/18, New pt appt on 11/21

## 2021-01-26 ENCOUNTER — Telehealth: Payer: Self-pay | Admitting: *Deleted

## 2021-01-27 ENCOUNTER — Inpatient Hospital Stay: Payer: Medicare PPO | Attending: Oncology

## 2021-01-27 ENCOUNTER — Other Ambulatory Visit: Payer: Self-pay

## 2021-01-27 DIAGNOSIS — N189 Chronic kidney disease, unspecified: Secondary | ICD-10-CM | POA: Diagnosis not present

## 2021-01-27 DIAGNOSIS — Z8501 Personal history of malignant neoplasm of esophagus: Secondary | ICD-10-CM | POA: Insufficient documentation

## 2021-01-27 DIAGNOSIS — I129 Hypertensive chronic kidney disease with stage 1 through stage 4 chronic kidney disease, or unspecified chronic kidney disease: Secondary | ICD-10-CM | POA: Diagnosis not present

## 2021-01-27 DIAGNOSIS — Z87891 Personal history of nicotine dependence: Secondary | ICD-10-CM | POA: Insufficient documentation

## 2021-01-27 DIAGNOSIS — D649 Anemia, unspecified: Secondary | ICD-10-CM

## 2021-01-27 DIAGNOSIS — N400 Enlarged prostate without lower urinary tract symptoms: Secondary | ICD-10-CM | POA: Diagnosis not present

## 2021-01-27 DIAGNOSIS — E785 Hyperlipidemia, unspecified: Secondary | ICD-10-CM | POA: Insufficient documentation

## 2021-01-27 DIAGNOSIS — D7589 Other specified diseases of blood and blood-forming organs: Secondary | ICD-10-CM | POA: Diagnosis not present

## 2021-01-27 DIAGNOSIS — D61818 Other pancytopenia: Secondary | ICD-10-CM | POA: Diagnosis not present

## 2021-01-27 LAB — CBC WITH DIFFERENTIAL (CANCER CENTER ONLY)
Abs Immature Granulocytes: 0 10*3/uL (ref 0.00–0.07)
Basophils Absolute: 0 10*3/uL (ref 0.0–0.1)
Basophils Relative: 0 %
Eosinophils Absolute: 0.1 10*3/uL (ref 0.0–0.5)
Eosinophils Relative: 4 %
HCT: 39.2 % (ref 39.0–52.0)
Hemoglobin: 13.8 g/dL (ref 13.0–17.0)
Immature Granulocytes: 0 %
Lymphocytes Relative: 24 %
Lymphs Abs: 0.6 10*3/uL — ABNORMAL LOW (ref 0.7–4.0)
MCH: 34.9 pg — ABNORMAL HIGH (ref 26.0–34.0)
MCHC: 35.2 g/dL (ref 30.0–36.0)
MCV: 99.2 fL (ref 80.0–100.0)
Monocytes Absolute: 0.2 10*3/uL (ref 0.1–1.0)
Monocytes Relative: 6 %
Neutro Abs: 1.8 10*3/uL (ref 1.7–7.7)
Neutrophils Relative %: 66 %
Platelet Count: 129 10*3/uL — ABNORMAL LOW (ref 150–400)
RBC: 3.95 MIL/uL — ABNORMAL LOW (ref 4.22–5.81)
RDW: 13.2 % (ref 11.5–15.5)
WBC Count: 2.7 10*3/uL — ABNORMAL LOW (ref 4.0–10.5)
nRBC: 0 % (ref 0.0–0.2)

## 2021-01-27 LAB — SAVE SMEAR(SSMR), FOR PROVIDER SLIDE REVIEW

## 2021-01-30 ENCOUNTER — Inpatient Hospital Stay: Payer: Medicare PPO | Admitting: Oncology

## 2021-01-30 ENCOUNTER — Other Ambulatory Visit: Payer: Self-pay

## 2021-01-30 ENCOUNTER — Inpatient Hospital Stay: Payer: Medicare PPO

## 2021-01-30 VITALS — BP 132/64 | HR 96 | Temp 98.0°F | Resp 18 | Ht 71.0 in | Wt 207.0 lb

## 2021-01-30 DIAGNOSIS — D7589 Other specified diseases of blood and blood-forming organs: Secondary | ICD-10-CM | POA: Diagnosis not present

## 2021-01-30 DIAGNOSIS — N189 Chronic kidney disease, unspecified: Secondary | ICD-10-CM | POA: Diagnosis not present

## 2021-01-30 DIAGNOSIS — E785 Hyperlipidemia, unspecified: Secondary | ICD-10-CM | POA: Diagnosis not present

## 2021-01-30 DIAGNOSIS — D649 Anemia, unspecified: Secondary | ICD-10-CM

## 2021-01-30 DIAGNOSIS — D61818 Other pancytopenia: Secondary | ICD-10-CM | POA: Diagnosis not present

## 2021-01-30 DIAGNOSIS — N400 Enlarged prostate without lower urinary tract symptoms: Secondary | ICD-10-CM | POA: Diagnosis not present

## 2021-01-30 DIAGNOSIS — I129 Hypertensive chronic kidney disease with stage 1 through stage 4 chronic kidney disease, or unspecified chronic kidney disease: Secondary | ICD-10-CM | POA: Diagnosis not present

## 2021-01-30 DIAGNOSIS — Z8501 Personal history of malignant neoplasm of esophagus: Secondary | ICD-10-CM | POA: Diagnosis not present

## 2021-01-30 DIAGNOSIS — Z87891 Personal history of nicotine dependence: Secondary | ICD-10-CM | POA: Diagnosis not present

## 2021-01-30 LAB — CMP (CANCER CENTER ONLY)
ALT: 19 U/L (ref 0–44)
AST: 18 U/L (ref 15–41)
Albumin: 4.7 g/dL (ref 3.5–5.0)
Alkaline Phosphatase: 47 U/L (ref 38–126)
Anion gap: 12 (ref 5–15)
BUN: 23 mg/dL (ref 8–23)
CO2: 22 mmol/L (ref 22–32)
Calcium: 9.6 mg/dL (ref 8.9–10.3)
Chloride: 106 mmol/L (ref 98–111)
Creatinine: 1.04 mg/dL (ref 0.61–1.24)
GFR, Estimated: >60 mL/min (ref >60)
Glucose, Bld: 105 mg/dL — ABNORMAL HIGH (ref 70–99)
Potassium: 3.4 mmol/L — ABNORMAL LOW (ref 3.5–5.1)
Sodium: 140 mmol/L (ref 135–145)
Total Bilirubin: 0.5 mg/dL (ref 0.3–1.2)
Total Protein: 7.2 g/dL (ref 6.5–8.1)

## 2021-01-30 LAB — VITAMIN B12: Vitamin B-12: 611 pg/mL (ref 180–914)

## 2021-01-30 LAB — TSH: TSH: 1.367 u[IU]/mL (ref 0.350–4.500)

## 2021-01-30 LAB — GAMMA GT: GGT: 29 U/L (ref 7–50)

## 2021-01-30 NOTE — Progress Notes (Signed)
[facility] New Patient Consult   Requesting MD: Bufford Buttner, Md [address] [place],  [place] [address]   Vincent Coleman 75 y.o.  [birth year]    Reason for Consult: Pancytopenia   HPI: Vincent Coleman fell while vacationing in [place] on 12/03/2020.  A CBC found the WBCs at 2.8, hemoglobin 11.8, MCV 96.4, platelets 119,000, and absolute neutrophil count 1.2.  Vincent Coleman is followed by nephrology for chronic renal insufficiency.  A CBC on 12/19/2020 found the hemoglobin of 12.2, platelets 140,000 (1 50-4 50), MCV 98, WBC 2.0, and absolute neutrophil count 1.3.  Vincent Coleman is not aware of a previous history of cytopenias.  Review of the chart revealed a hemoglobin of 12.5, MCV 86.8, platelets 110,000, and WBC 4.8 on 04/25/2016.  Vincent Coleman feels well at present.  Past Medical History:  Diagnosis Date   CKD (chronic kidney disease)    DOE (dyspnea on exertion)    Dysuria    Emphysema of lung (HCC)    History of throat cancer    Hyperlipidemia    Hypertension     .  "Esophagus "cancer 20 years ago treated with surgery and radiation  Past surgical history: Esophagus surgery for removal of cancer 20 years ago Lung granuloma surgery 20 years ago  Medications: Reviewed  Allergies: No Known Allergies  Family history: Father had prostate cancer and lung cancer.  A sister had breast cancer.  Social History:   Vincent Coleman lives with his wife in [place].  Vincent Coleman is retired from [employer].  Vincent Coleman quit smoking cigarettes 20 years ago.  Vincent Coleman previously smoked 2 packs a day for 20 years.  Vincent Coleman drinks 4-6 beers 2 nights per week for the past 3-4 years, Vincent Coleman previously drank 5 nights per week.  No transfusion history.  No risk factor for HIV or hepatitis.  ROS:   Positives include: Fall while visiting [facility] September 2022, dysphagia with large pills, urinary frequency, constipation, thumb arthritis, trigger fingers in the right hand  A complete ROS was otherwise  negative.  Physical Exam:  Blood pressure 132/64, pulse 96, temperature 98 F (36.7 C), temperature source Oral, resp. rate 18, height 5\' 11"  (1.803 m), weight 207 lb (93.9 kg), SpO2 98 %.  HEENT: Oropharynx without visible mass, neck without mass Lungs: Clear bilaterally with a prolonged expiratory phase, no respiratory distress Cardiac: Regular rate and rhythm Abdomen: No hepatosplenomegaly, no apparent ascites  Vascular: No leg edema Lymph nodes: No cervical, supraclavicular, axillary, or inguinal nodes Neurologic: Alert and oriented, the motor exam appears intact in the upper and lower extremities bilaterally Skin: No rash, abrasions at the lower leg bilaterally, few telangiectasias at the upper chest Musculoskeletal: No spine tenderness   LAB:  CBC  Lab Results  Component Value Date   WBC 2.7 (L) 01/27/2021   HGB 13.8 01/27/2021   HCT 39.2 01/27/2021   MCV 99.2 01/27/2021   PLT 129 (L) 01/27/2021   NEUTROABS 1.8 01/27/2021    Blood smear: The white cell morphology is unremarkable.  The majority the white cells are mature neutrophils and lymphocytes.  The platelets appear mildly decreased in number.  No platelet clumps.  Few ovalocytes and teardrops.  The polychromasia is not increased.  No nucleated red cells.    CMP  Lab Results  Component Value Date   NA 136 04/25/2016   K 3.6 04/25/2016   CL 108 04/25/2016   CO2 22 04/25/2016   GLUCOSE 100 (H) 04/25/2016   BUN 16 04/25/2016  CREATININE 0.88 04/25/2016   CALCIUM 8.4 (L) 04/25/2016   PROT 5.8 (L) 04/24/2016   ALBUMIN 3.0 (L) 04/24/2016   AST 18 04/24/2016   ALT 18 04/24/2016   ALKPHOS 40 04/24/2016   BILITOT 1.1 04/24/2016   GFRNONAA >60 04/25/2016   GFRAA >60 04/25/2016     Assessment/Plan:   Leukopenia, thrombocytopenia, and Red cell macrocytosis  Chronic renal insufficiency  3.   Report of "esophagus cancer "diagnosed greater than 20 years ago and treated with local surgery followed by  radiation  4.   Remote history of heavy tobacco use  5.   Current and chronic history of alcohol use  6.   Status post left lung surgery for removal of a "a lung granuloma ".  7.   BPH  8.   Admission in February 2018 with urosepsis   Disposition:   Vincent Coleman is referred for evaluation of pancytopenia.  Hemoglobin is normal today with the MCV in the upper end of the normal range.  The platelet count was mildly low when Vincent Coleman was hospitalized with urosepsis in February 2018.  Vincent Coleman has a longstanding history of heavy alcohol use.  I suspect the cytopenias and elevated MCV are related to bone marrow toxicity from alcohol.  Vincent Coleman may have chronic liver disease.  The differential diagnosis includes myelodysplasia, hypothyroidism, and vitamin B12 deficiency.  We obtained a vitamin B12 level, myeloma panel, TSH, and liver panel today.  Vincent Coleman will return for an office visit and CBC in 4 months.  Vincent Coleman knows to seek medical attention for symptoms of an infection.  I recommend Vincent Coleman remain up-to-date on influenza, COVID-19, and pneumococcal vaccines.  Recommendations: Follow-up on laboratory studies ordered today Decrease alcohol use Evaluate for chronic liver disease per primary provider Follow-up for an office visit and CBC in 4 months.  Thornton Papas, MD  01/30/2021, 1:58 PM

## 2021-01-31 LAB — KAPPA/LAMBDA LIGHT CHAINS
Kappa free light chain: 20.8 mg/L — ABNORMAL HIGH (ref 3.3–19.4)
Kappa, lambda light chain ratio: 1.93 — ABNORMAL HIGH (ref 0.26–1.65)
Lambda free light chains: 10.8 mg/L (ref 5.7–26.3)

## 2021-02-06 LAB — MULTIPLE MYELOMA PANEL, SERUM
Albumin SerPl Elph-Mcnc: 4.2 g/dL (ref 2.9–4.4)
Albumin/Glob SerPl: 1.7 (ref 0.7–1.7)
Alpha 1: 0.2 g/dL (ref 0.0–0.4)
Alpha2 Glob SerPl Elph-Mcnc: 0.6 g/dL (ref 0.4–1.0)
B-Globulin SerPl Elph-Mcnc: 1 g/dL (ref 0.7–1.3)
Gamma Glob SerPl Elph-Mcnc: 0.8 g/dL (ref 0.4–1.8)
Globulin, Total: 2.6 g/dL (ref 2.2–3.9)
IgA: 146 mg/dL (ref 61–437)
IgG (Immunoglobin G), Serum: 779 mg/dL (ref 603–1613)
IgM (Immunoglobulin M), Srm: 74 mg/dL (ref 15–143)
Total Protein ELP: 6.8 g/dL (ref 6.0–8.5)

## 2021-02-07 ENCOUNTER — Telehealth: Payer: Self-pay | Admitting: Surgery

## 2021-02-07 NOTE — Telephone Encounter (Signed)
I called the pt to discuss his lab results.  Per Dr. Truett Perna, the pt's labs to evaluate for pancytopenia were unremarkable, so the pt can follow up as scheduled.  The pt verbalized understanding, and he was told to call our office back if he had any questions or concerns.

## 2021-02-14 DIAGNOSIS — R911 Solitary pulmonary nodule: Secondary | ICD-10-CM | POA: Diagnosis not present

## 2021-02-14 DIAGNOSIS — E039 Hypothyroidism, unspecified: Secondary | ICD-10-CM | POA: Diagnosis not present

## 2021-02-22 DIAGNOSIS — J342 Deviated nasal septum: Secondary | ICD-10-CM | POA: Diagnosis not present

## 2021-03-17 DIAGNOSIS — I129 Hypertensive chronic kidney disease with stage 1 through stage 4 chronic kidney disease, or unspecified chronic kidney disease: Secondary | ICD-10-CM | POA: Diagnosis not present

## 2021-03-17 DIAGNOSIS — D61818 Other pancytopenia: Secondary | ICD-10-CM | POA: Diagnosis not present

## 2021-03-17 DIAGNOSIS — N1831 Chronic kidney disease, stage 3a: Secondary | ICD-10-CM | POA: Diagnosis not present

## 2021-03-17 DIAGNOSIS — E785 Hyperlipidemia, unspecified: Secondary | ICD-10-CM | POA: Diagnosis not present

## 2021-03-17 DIAGNOSIS — M109 Gout, unspecified: Secondary | ICD-10-CM | POA: Diagnosis not present

## 2021-03-17 DIAGNOSIS — N400 Enlarged prostate without lower urinary tract symptoms: Secondary | ICD-10-CM | POA: Diagnosis not present

## 2021-03-17 DIAGNOSIS — N2581 Secondary hyperparathyroidism of renal origin: Secondary | ICD-10-CM | POA: Diagnosis not present

## 2021-03-20 ENCOUNTER — Other Ambulatory Visit: Payer: Self-pay | Admitting: Family Medicine

## 2021-03-20 DIAGNOSIS — R9389 Abnormal findings on diagnostic imaging of other specified body structures: Secondary | ICD-10-CM

## 2021-04-02 ENCOUNTER — Other Ambulatory Visit: Payer: Self-pay | Admitting: Urology

## 2021-04-12 ENCOUNTER — Ambulatory Visit
Admission: RE | Admit: 2021-04-12 | Discharge: 2021-04-12 | Disposition: A | Discharge status: Home / Self Care | Payer: Medicare PPO | Source: Ambulatory Visit | Attending: Family Medicine | Admitting: Family Medicine

## 2021-04-12 DIAGNOSIS — I700 Atherosclerosis of aorta: Secondary | ICD-10-CM | POA: Diagnosis not present

## 2021-04-12 DIAGNOSIS — R911 Solitary pulmonary nodule: Secondary | ICD-10-CM | POA: Diagnosis not present

## 2021-04-12 DIAGNOSIS — J439 Emphysema, unspecified: Secondary | ICD-10-CM | POA: Diagnosis not present

## 2021-04-12 DIAGNOSIS — R9389 Abnormal findings on diagnostic imaging of other specified body structures: Secondary | ICD-10-CM

## 2021-04-25 DIAGNOSIS — E785 Hyperlipidemia, unspecified: Secondary | ICD-10-CM | POA: Diagnosis not present

## 2021-04-25 DIAGNOSIS — I700 Atherosclerosis of aorta: Secondary | ICD-10-CM | POA: Diagnosis not present

## 2021-04-25 DIAGNOSIS — Z131 Encounter for screening for diabetes mellitus: Secondary | ICD-10-CM | POA: Diagnosis not present

## 2021-04-25 DIAGNOSIS — R911 Solitary pulmonary nodule: Secondary | ICD-10-CM | POA: Diagnosis not present

## 2021-04-25 DIAGNOSIS — I2510 Atherosclerotic heart disease of native coronary artery without angina pectoris: Secondary | ICD-10-CM | POA: Diagnosis not present

## 2021-04-25 DIAGNOSIS — I10 Essential (primary) hypertension: Secondary | ICD-10-CM | POA: Diagnosis not present

## 2021-04-25 DIAGNOSIS — Z0000 Encounter for general adult medical examination without abnormal findings: Secondary | ICD-10-CM | POA: Diagnosis not present

## 2021-04-25 DIAGNOSIS — E039 Hypothyroidism, unspecified: Secondary | ICD-10-CM | POA: Diagnosis not present

## 2021-04-25 DIAGNOSIS — R7301 Impaired fasting glucose: Secondary | ICD-10-CM | POA: Diagnosis not present

## 2021-04-25 DIAGNOSIS — Z1159 Encounter for screening for other viral diseases: Secondary | ICD-10-CM | POA: Diagnosis not present

## 2021-04-27 DIAGNOSIS — L570 Actinic keratosis: Secondary | ICD-10-CM | POA: Diagnosis not present

## 2021-04-27 DIAGNOSIS — L821 Other seborrheic keratosis: Secondary | ICD-10-CM | POA: Diagnosis not present

## 2021-04-27 DIAGNOSIS — Z85828 Personal history of other malignant neoplasm of skin: Secondary | ICD-10-CM | POA: Diagnosis not present

## 2021-04-27 DIAGNOSIS — D225 Melanocytic nevi of trunk: Secondary | ICD-10-CM | POA: Diagnosis not present

## 2021-04-27 DIAGNOSIS — D0461 Carcinoma in situ of skin of right upper limb, including shoulder: Secondary | ICD-10-CM | POA: Diagnosis not present

## 2021-04-27 DIAGNOSIS — L814 Other melanin hyperpigmentation: Secondary | ICD-10-CM | POA: Diagnosis not present

## 2021-05-12 DIAGNOSIS — U071 COVID-19: Secondary | ICD-10-CM | POA: Diagnosis not present

## 2021-05-30 ENCOUNTER — Inpatient Hospital Stay: Payer: Medicare PPO | Attending: Oncology | Admitting: Oncology

## 2021-05-30 ENCOUNTER — Inpatient Hospital Stay: Payer: Medicare PPO

## 2021-05-30 ENCOUNTER — Other Ambulatory Visit: Payer: Self-pay

## 2021-05-30 VITALS — BP 132/63 | HR 75 | Temp 98.1°F | Resp 20 | Ht 71.0 in | Wt 209.6 lb

## 2021-05-30 DIAGNOSIS — Z8501 Personal history of malignant neoplasm of esophagus: Secondary | ICD-10-CM | POA: Diagnosis not present

## 2021-05-30 DIAGNOSIS — D696 Thrombocytopenia, unspecified: Secondary | ICD-10-CM | POA: Diagnosis not present

## 2021-05-30 DIAGNOSIS — D649 Anemia, unspecified: Secondary | ICD-10-CM

## 2021-05-30 DIAGNOSIS — N400 Enlarged prostate without lower urinary tract symptoms: Secondary | ICD-10-CM | POA: Insufficient documentation

## 2021-05-30 DIAGNOSIS — Z8616 Personal history of COVID-19: Secondary | ICD-10-CM | POA: Insufficient documentation

## 2021-05-30 DIAGNOSIS — F1090 Alcohol use, unspecified, uncomplicated: Secondary | ICD-10-CM | POA: Insufficient documentation

## 2021-05-30 DIAGNOSIS — Z87891 Personal history of nicotine dependence: Secondary | ICD-10-CM | POA: Diagnosis not present

## 2021-05-30 DIAGNOSIS — N189 Chronic kidney disease, unspecified: Secondary | ICD-10-CM | POA: Insufficient documentation

## 2021-05-30 DIAGNOSIS — D7589 Other specified diseases of blood and blood-forming organs: Secondary | ICD-10-CM | POA: Insufficient documentation

## 2021-05-30 DIAGNOSIS — D72819 Decreased white blood cell count, unspecified: Secondary | ICD-10-CM | POA: Insufficient documentation

## 2021-05-30 LAB — CBC WITH DIFFERENTIAL (CANCER CENTER ONLY)
Abs Immature Granulocytes: 0 10*3/uL (ref 0.00–0.07)
Basophils Absolute: 0 10*3/uL (ref 0.0–0.1)
Basophils Relative: 0 %
Eosinophils Absolute: 0.1 10*3/uL (ref 0.0–0.5)
Eosinophils Relative: 5 %
HCT: 38.8 % — ABNORMAL LOW (ref 39.0–52.0)
Hemoglobin: 14.1 g/dL (ref 13.0–17.0)
Immature Granulocytes: 0 %
Lymphocytes Relative: 37 %
Lymphs Abs: 0.9 10*3/uL (ref 0.7–4.0)
MCH: 34.4 pg — ABNORMAL HIGH (ref 26.0–34.0)
MCHC: 36.3 g/dL — ABNORMAL HIGH (ref 30.0–36.0)
MCV: 94.6 fL (ref 80.0–100.0)
Monocytes Absolute: 0.2 10*3/uL (ref 0.1–1.0)
Monocytes Relative: 7 %
Neutro Abs: 1.3 10*3/uL — ABNORMAL LOW (ref 1.7–7.7)
Neutrophils Relative %: 51 %
Platelet Count: 132 10*3/uL — ABNORMAL LOW (ref 150–400)
RBC: 4.1 MIL/uL — ABNORMAL LOW (ref 4.22–5.81)
RDW: 13.3 % (ref 11.5–15.5)
WBC Count: 2.6 10*3/uL — ABNORMAL LOW (ref 4.0–10.5)
nRBC: 0 % (ref 0.0–0.2)

## 2021-05-30 NOTE — Progress Notes (Signed)
?  [facility] ?OFFICE PROGRESS NOTE ? ? ?Diagnosis: Leukopenia/thrombocytopenia ? ?INTERVAL HISTORY:  ? ?Vincent Coleman returns as scheduled.  He feels well.  He drinks 4 beers twice weekly.  He had COVID-19 infection earlier this months treated with medical therapy.  He feels better.  No other complaint. ? ?Objective: ? ?Vital signs in last 24 hours: ? ?Blood pressure 132/63, pulse 75, temperature 98.1 ?F (36.7 ?C), temperature source Oral, resp. rate 20, height 5\' 11"  (1.803 m), weight 209 lb 9.6 oz (95.1 kg), SpO2 98 %. ?  ? ? ?Lymphatics: No cervical, supraclavicular, axillary, or inguinal nodes ?Resp: Lungs clear bilaterally ?Cardio: Regular rate and rhythm ?GI: No hepatosplenomegaly ?Vascular: No leg edema ?  ? ? ?Lab Results: ? ?Lab Results  ?Component Value Date  ? WBC 2.6 (L) 05/30/2021  ? HGB 14.1 05/30/2021  ? HCT 38.8 (L) 05/30/2021  ? MCV 94.6 05/30/2021  ? PLT 132 (L) 05/30/2021  ? NEUTROABS 1.3 (L) 05/30/2021  ? ? ?CMP  ?Lab Results  ?Component Value Date  ? NA 140 01/30/2021  ? K 3.4 (L) 01/30/2021  ? CL 106 01/30/2021  ? CO2 22 01/30/2021  ? GLUCOSE 105 (H) 01/30/2021  ? BUN 23 01/30/2021  ? CREATININE 1.04 01/30/2021  ? CALCIUM 9.6 01/30/2021  ? PROT 7.2 01/30/2021  ? ALBUMIN 4.7 01/30/2021  ? AST 18 01/30/2021  ? ALT 19 01/30/2021  ? ALKPHOS 47 01/30/2021  ? BILITOT 0.5 01/30/2021  ? GFRNONAA >60 01/30/2021  ? GFRAA >60 04/25/2016  ? ? ? ?Medications: I have reviewed the patient's current medications. ? ? ?Assessment/Plan: ?Leukopenia, thrombocytopenia, and Red cell macrocytosis ? ?Chronic renal insufficiency ? ?3.   Report of "esophagus cancer "diagnosed greater than 20 years ago and treated with local surgery followed by radiation ? ?4.   Remote history of heavy tobacco use ? ?5.   Current and chronic history of alcohol use ? ?6.   Status post left lung surgery for removal of a "a lung granuloma ". ? ?7.   BPH ? ?8.   Admission in February 2018 with urosepsis ? ? ? ? ?Disposition: ?Mr.  Chaffins appears stable.  He has stable mild thrombocytopenia and leukopenia.  The neutrophil count is mildly decreased today.  This could be in part related to the recent infection.  I suspect the chronic hematologic findings are related to subclinical chronic liver disease or myelodysplasia.  A laboratory evaluation for myeloma and vitamin B12/TSH levels were unremarkable in November 2022. ? ?Mr. Geddie will remain up-to-date on influenza, COVID-19, and pneumonia vaccines.  He will return for an office visit in 6 months.  He may be at increased risk for infection with the leukopenia. ?, ?Betsy Coder, MD ? ?05/30/2021  ?8:19 AM ? ? ?

## 2021-10-25 DIAGNOSIS — D225 Melanocytic nevi of trunk: Secondary | ICD-10-CM | POA: Diagnosis not present

## 2021-10-25 DIAGNOSIS — D692 Other nonthrombocytopenic purpura: Secondary | ICD-10-CM | POA: Diagnosis not present

## 2021-10-25 DIAGNOSIS — Z85828 Personal history of other malignant neoplasm of skin: Secondary | ICD-10-CM | POA: Diagnosis not present

## 2021-10-25 DIAGNOSIS — D1801 Hemangioma of skin and subcutaneous tissue: Secondary | ICD-10-CM | POA: Diagnosis not present

## 2021-10-25 DIAGNOSIS — L821 Other seborrheic keratosis: Secondary | ICD-10-CM | POA: Diagnosis not present

## 2021-10-25 DIAGNOSIS — L570 Actinic keratosis: Secondary | ICD-10-CM | POA: Diagnosis not present

## 2021-10-25 DIAGNOSIS — C4442 Squamous cell carcinoma of skin of scalp and neck: Secondary | ICD-10-CM | POA: Diagnosis not present

## 2021-10-25 DIAGNOSIS — L814 Other melanin hyperpigmentation: Secondary | ICD-10-CM | POA: Diagnosis not present

## 2021-10-26 ENCOUNTER — Ambulatory Visit (HOSPITAL_COMMUNITY)
Admission: RE | Admit: 2021-10-26 | Discharge: 2021-10-26 | Disposition: A | Discharge status: Home / Self Care | Payer: Medicare PPO | Source: Ambulatory Visit | Attending: Cardiovascular Disease | Admitting: Cardiovascular Disease

## 2021-10-26 DIAGNOSIS — I6523 Occlusion and stenosis of bilateral carotid arteries: Secondary | ICD-10-CM | POA: Diagnosis not present

## 2021-10-30 ENCOUNTER — Other Ambulatory Visit: Payer: Self-pay

## 2021-10-30 DIAGNOSIS — I6523 Occlusion and stenosis of bilateral carotid arteries: Secondary | ICD-10-CM

## 2021-11-21 DIAGNOSIS — Z23 Encounter for immunization: Secondary | ICD-10-CM | POA: Diagnosis not present

## 2021-11-30 ENCOUNTER — Inpatient Hospital Stay: Payer: Medicare PPO | Attending: Family Medicine

## 2021-11-30 ENCOUNTER — Inpatient Hospital Stay: Payer: Medicare PPO | Admitting: Oncology

## 2021-12-29 ENCOUNTER — Ambulatory Visit: Payer: Medicare PPO | Admitting: Urology

## 2021-12-29 VITALS — BP 131/64 | HR 80

## 2021-12-29 DIAGNOSIS — R972 Elevated prostate specific antigen [PSA]: Secondary | ICD-10-CM

## 2021-12-29 DIAGNOSIS — N401 Enlarged prostate with lower urinary tract symptoms: Secondary | ICD-10-CM

## 2021-12-29 DIAGNOSIS — R3915 Urgency of urination: Secondary | ICD-10-CM

## 2021-12-29 LAB — BLADDER SCAN AMB NON-IMAGING: Scan Result: 97

## 2021-12-29 LAB — URINALYSIS, ROUTINE W REFLEX MICROSCOPIC
Bilirubin, UA: NEGATIVE
Ketones, UA: NEGATIVE
Leukocytes,UA: NEGATIVE
Nitrite, UA: NEGATIVE
RBC, UA: NEGATIVE
Specific Gravity, UA: 1.015 (ref 1.005–1.030)
Urobilinogen, Ur: 0.2 mg/dL (ref 0.2–1.0)
pH, UA: 5 (ref 5.0–7.5)

## 2021-12-29 LAB — MICROSCOPIC EXAMINATION
Bacteria, UA: NONE SEEN
Epithelial Cells (non renal): NONE SEEN /hpf (ref 0–10)
RBC, Urine: NONE SEEN /hpf (ref 0–2)
WBC, UA: NONE SEEN /hpf (ref 0–5)

## 2021-12-29 MED ORDER — TAMSULOSIN HCL 0.4 MG PO CAPS: 0.4000 mg | ORAL_CAPSULE | Freq: Two times a day (BID) | ORAL | 3 refills | Status: DC

## 2021-12-29 NOTE — Progress Notes (Unsigned)
12/29/2021 10:29 AM   Vincent Coleman February 08, 1946 [id number]  Referring provider: Lujean Amel, MD [address],  [place] [address]  No chief complaint on file.   HPI: Vincent Coleman is a 76yo here for followup for BPH and urinary urgency. IPSS 12 QOL 3. He has morning urgency which resolves throughout the day. Urine stream strong    PMH: Past Medical History:  Diagnosis Date   CKD (chronic kidney disease)    DOE (dyspnea on exertion)    Dysuria    Emphysema of lung (HCC)    History of throat cancer    Hyperlipidemia    Hypertension     Surgical History: No past surgical history on file.  Home Medications:  Allergies as of 12/29/2021   No Known Allergies      Medication List        Accurate as of December 29, 2021 10:29 AM. If you have any questions, ask your nurse or doctor.          Farxiga 10 MG Tabs tablet Generic drug: dapagliflozin propanediol Take 10 mg by mouth daily.   levothyroxine 137 MCG tablet Commonly known as: SYNTHROID Take 137 mcg by mouth daily.   lisinopril 40 MG tablet Commonly known as: ZESTRIL Take 40 mg by mouth daily.   LISINOPRIL-HYDROCHLOROTHIAZIDE PO Take 40 mg by mouth.   meloxicam 15 MG tablet Commonly known as: MOBIC Take 15 mg by mouth 2 (two) times daily.   omeprazole 20 MG capsule Commonly known as: PRILOSEC Take 20 mg by mouth 2 (two) times daily.   rosuvastatin 40 MG tablet Commonly known as: CRESTOR Take 40 mg by mouth daily.   tamsulosin 0.4 MG Caps capsule Commonly known as: FLOMAX TAKE 1 CAPSULE (0.4 MG TOTAL) BY MOUTH DAILY AFTER BREAKFAST.   Ventolin HFA 108 (90 Base) MCG/ACT inhaler Generic drug: albuterol Take 2 puffs by mouth as needed. As needed   VITAMIN D2 PO Take 800 Units by mouth daily.        Allergies: No Known Allergies  Family History: Family History  Problem Relation Age of Onset   Cancer Father     Social History:  reports that he has quit  smoking. He has never used smokeless tobacco. He reports that he does not drink alcohol and does not use drugs.  ROS: All other review of systems were reviewed and are negative except what is noted above in HPI  Physical Exam: BP 131/64   Pulse 80   Constitutional:  Alert and oriented, No acute distress. HEENT: [place] AT, moist mucus membranes.  Trachea midline, no masses. Cardiovascular: No clubbing, cyanosis, or edema. Respiratory: Normal respiratory effort, no increased work of breathing. GI: Abdomen is soft, nontender, nondistended, no abdominal masses GU: No CVA tenderness.  Lymph: No cervical or inguinal lymphadenopathy. Skin: No rashes, bruises or suspicious lesions. Neurologic: Grossly intact, no focal deficits, moving all 4 extremities. Psychiatric: Normal mood and affect.  Laboratory Data: Lab Results  Component Value Date   WBC 2.6 (L) 05/30/2021   HGB 14.1 05/30/2021   HCT 38.8 (L) 05/30/2021   MCV 94.6 05/30/2021   PLT 132 (L) 05/30/2021    Lab Results  Component Value Date   CREATININE 1.04 01/30/2021    No results found for: "PSA"  No results found for: "TESTOSTERONE"  No results found for: "HGBA1C"  Urinalysis    Component Value Date/Time   COLORURINE YELLOW 04/22/2016 [place] 12/30/2020 1118  LABSPEC 1.023 04/22/2016 2339   PHURINE 5.0 04/22/2016 2339   GLUCOSEU 3+ (A) 12/30/2020 1118   HGBUR MODERATE (A) 04/22/2016 2339   BILIRUBINUR Negative 12/30/2020 1118   KETONESUR 5 (A) 04/22/2016 2339   PROTEINUR 2+ (A) 12/30/2020 1118   PROTEINUR 30 (A) 04/22/2016 2339   UROBILINOGEN 0.2 11/20/2019 0859   NITRITE Negative 12/30/2020 1118   NITRITE POSITIVE (A) 04/22/2016 2339   LEUKOCYTESUR Negative 12/30/2020 1118    Lab Results  Component Value Date   LABMICR See below: 12/30/2020   WBCUA None seen 12/30/2020   LABEPIT 0-10 12/30/2020   MUCUS Present 12/30/2020   BACTERIA None seen 12/30/2020    Pertinent Imaging: *** No  results found for this or any previous visit.  No results found for this or any previous visit.  No results found for this or any previous visit.  No results found for this or any previous visit.  Results for orders placed during the hospital encounter of 05/31/16  US Renal  Narrative CLINICAL DATA:  Stage III chronic kidney disease, history hypertension, throat cancer, former smoker  EXAM: RENAL / URINARY TRACT ULTRASOUND COMPLETE  COMPARISON:  None  FINDINGS: Right Kidney:  Length: 11.6 cm. Mild age-related cortical thinning. Normal cortical echogenicity. No mass, hydronephrosis or shadowing calcification.  Left Kidney:  Length: 11.6 cm. Normal cortical echogenicity. Mild age-related renal cortical thinning. Small cystic lesion at upper pole 14 x 8 x 10 mm. No additional renal mass, hydronephrosis or shadowing calcification.  Bladder:  Normal appearance. BILATERAL ureteral jets visualized. Prostate gland appears enlarged 4.6 cm transverse by 2.8 cm AP, length inadequately establish but likely at least 4.4 cm.  IMPRESSION: Small LEFT renal cyst.  Age-related renal cortical thinning.  Prostatic enlargement.   Electronically Signed By: Lavonia Dana M.D. On: 05/31/2016 16:23  No valid procedures specified. No results found for this or any previous visit.  No results found for this or any previous visit.   Assessment & Plan:    1. Benign localized prostatic hyperplasia with lower urinary tract symptoms (LUTS) *** - Urinalysis, Routine w reflex microscopic - BLADDER SCAN AMB NON-IMAGING   No follow-ups on file.  Nicolette Bang, MD  [facility] [place]

## 2021-12-29 NOTE — Progress Notes (Unsigned)
post void residual=97 

## 2022-01-01 DIAGNOSIS — R972 Elevated prostate specific antigen [PSA]: Secondary | ICD-10-CM | POA: Diagnosis not present

## 2022-01-02 LAB — PSA, TOTAL AND FREE
PSA, Free Pct: 39.8 %
PSA, Free: 1.75 ng/mL
Prostate Specific Ag, Serum: 4.4 ng/mL — ABNORMAL HIGH (ref 0.0–4.0)

## 2022-01-04 ENCOUNTER — Encounter: Payer: Self-pay | Admitting: Urology

## 2022-01-04 NOTE — Patient Instructions (Signed)

## 2022-01-11 ENCOUNTER — Inpatient Hospital Stay: Payer: Medicare PPO | Attending: Family Medicine

## 2022-01-11 ENCOUNTER — Inpatient Hospital Stay: Payer: Medicare PPO | Admitting: Oncology

## 2022-01-11 VITALS — BP 98/65 | HR 60 | Temp 98.2°F | Resp 18 | Ht 71.0 in | Wt 205.2 lb

## 2022-01-11 DIAGNOSIS — N189 Chronic kidney disease, unspecified: Secondary | ICD-10-CM | POA: Diagnosis not present

## 2022-01-11 DIAGNOSIS — D696 Thrombocytopenia, unspecified: Secondary | ICD-10-CM | POA: Insufficient documentation

## 2022-01-11 DIAGNOSIS — D649 Anemia, unspecified: Secondary | ICD-10-CM

## 2022-01-11 DIAGNOSIS — N400 Enlarged prostate without lower urinary tract symptoms: Secondary | ICD-10-CM | POA: Diagnosis not present

## 2022-01-11 DIAGNOSIS — D7589 Other specified diseases of blood and blood-forming organs: Secondary | ICD-10-CM | POA: Diagnosis not present

## 2022-01-11 DIAGNOSIS — D72819 Decreased white blood cell count, unspecified: Secondary | ICD-10-CM | POA: Insufficient documentation

## 2022-01-11 LAB — CBC WITH DIFFERENTIAL (CANCER CENTER ONLY)
Abs Immature Granulocytes: 0.01 10*3/uL (ref 0.00–0.07)
Basophils Absolute: 0 10*3/uL (ref 0.0–0.1)
Basophils Relative: 1 %
Eosinophils Absolute: 0.1 10*3/uL (ref 0.0–0.5)
Eosinophils Relative: 3 %
HCT: 38.6 % — ABNORMAL LOW (ref 39.0–52.0)
Hemoglobin: 13.4 g/dL (ref 13.0–17.0)
Immature Granulocytes: 1 %
Lymphocytes Relative: 24 %
Lymphs Abs: 0.5 10*3/uL — ABNORMAL LOW (ref 0.7–4.0)
MCH: 34.7 pg — ABNORMAL HIGH (ref 26.0–34.0)
MCHC: 34.7 g/dL (ref 30.0–36.0)
MCV: 100 fL (ref 80.0–100.0)
Monocytes Absolute: 0.1 10*3/uL (ref 0.1–1.0)
Monocytes Relative: 5 %
Neutro Abs: 1.5 10*3/uL — ABNORMAL LOW (ref 1.7–7.7)
Neutrophils Relative %: 66 %
Platelet Count: 113 10*3/uL — ABNORMAL LOW (ref 150–400)
RBC: 3.86 MIL/uL — ABNORMAL LOW (ref 4.22–5.81)
RDW: 14.6 % (ref 11.5–15.5)
WBC Count: 2.2 10*3/uL — ABNORMAL LOW (ref 4.0–10.5)
nRBC: 0 % (ref 0.0–0.2)

## 2022-01-11 NOTE — Progress Notes (Signed)
  [place] OFFICE PROGRESS NOTE   Diagnosis: Leukopenia, thrombocytopenia  INTERVAL HISTORY:   Vincent Coleman returns as scheduled but he feels well.  No recent infection.  No bleeding.  He drinks approximately 5 beers once per week.  Objective:  Vital signs in last 24 hours:  Blood pressure 98/65, pulse 60, temperature 98.2 F (36.8 C), resp. rate 18, height 5\' 11"  (1.803 m), weight 205 lb 3.2 oz (93.1 kg).     Lymphatics: No cervical, supraclavicular, axillary, or inguinal nodes Resp: Lungs clear bilaterally Cardio: Regular rate and rhythm GI: No hepatosplenomegaly, nontender, no apparent ascites Vascular: No leg edema    Lab Results:  Lab Results  Component Value Date   WBC 2.6 (L) 05/30/2021   HGB 14.1 05/30/2021   HCT 38.8 (L) 05/30/2021   MCV 94.6 05/30/2021   PLT 132 (L) 05/30/2021   NEUTROABS 1.3 (L) 05/30/2021    CMP  Lab Results  Component Value Date   NA 140 01/30/2021   K 3.4 (L) 01/30/2021   CL 106 01/30/2021   CO2 22 01/30/2021   GLUCOSE 105 (H) 01/30/2021   BUN 23 01/30/2021   CREATININE 1.04 01/30/2021   CALCIUM 9.6 01/30/2021   PROT 7.2 01/30/2021   ALBUMIN 4.7 01/30/2021   AST 18 01/30/2021   ALT 19 01/30/2021   ALKPHOS 47 01/30/2021   BILITOT 0.5 01/30/2021   GFRNONAA >60 01/30/2021   GFRAA >60 04/25/2016     Medications: I have reviewed the patient's current medications.   Assessment/Plan: Leukopenia, thrombocytopenia, and Red cell macrocytosis  Chronic renal insufficiency  3.   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   Remote history of heavy tobacco use  5.   Current and chronic history of alcohol use  6.   Status post left lung surgery for removal of a "a lung granuloma ".  7.   BPH  8.   Admission in February 2018 with urosepsis     Disposition: Vincent Coleman is stable from a hematologic standpoint.  He has chronic  leukopenia/thrombocytopenia.  I suspect he has chronic liver disease related to alcohol use.  The differential diagnosis includes myelodysplasia.  The plan is to continue observation.  I recommended he remain up-to-date on pneumonia, COVID-19, and influenza vaccines.  He will return for an office visit in 6-8 months.  Betsy Coder, MD  01/11/2022  8:58 AM

## 2022-01-26 DIAGNOSIS — R81 Glycosuria: Secondary | ICD-10-CM | POA: Diagnosis not present

## 2022-01-26 DIAGNOSIS — R399 Unspecified symptoms and signs involving the genitourinary system: Secondary | ICD-10-CM | POA: Diagnosis not present

## 2022-04-02 DIAGNOSIS — N400 Enlarged prostate without lower urinary tract symptoms: Secondary | ICD-10-CM | POA: Diagnosis not present

## 2022-04-02 DIAGNOSIS — N2581 Secondary hyperparathyroidism of renal origin: Secondary | ICD-10-CM | POA: Diagnosis not present

## 2022-04-02 DIAGNOSIS — M109 Gout, unspecified: Secondary | ICD-10-CM | POA: Diagnosis not present

## 2022-04-02 DIAGNOSIS — I129 Hypertensive chronic kidney disease with stage 1 through stage 4 chronic kidney disease, or unspecified chronic kidney disease: Secondary | ICD-10-CM | POA: Diagnosis not present

## 2022-04-02 DIAGNOSIS — N1831 Chronic kidney disease, stage 3a: Secondary | ICD-10-CM | POA: Diagnosis not present

## 2022-04-02 DIAGNOSIS — D72819 Decreased white blood cell count, unspecified: Secondary | ICD-10-CM | POA: Diagnosis not present

## 2022-04-03 DIAGNOSIS — N1831 Chronic kidney disease, stage 3a: Secondary | ICD-10-CM | POA: Diagnosis not present

## 2022-04-30 DIAGNOSIS — Z85828 Personal history of other malignant neoplasm of skin: Secondary | ICD-10-CM | POA: Diagnosis not present

## 2022-04-30 DIAGNOSIS — D1801 Hemangioma of skin and subcutaneous tissue: Secondary | ICD-10-CM | POA: Diagnosis not present

## 2022-04-30 DIAGNOSIS — L814 Other melanin hyperpigmentation: Secondary | ICD-10-CM | POA: Diagnosis not present

## 2022-04-30 DIAGNOSIS — L821 Other seborrheic keratosis: Secondary | ICD-10-CM | POA: Diagnosis not present

## 2022-04-30 DIAGNOSIS — L570 Actinic keratosis: Secondary | ICD-10-CM | POA: Diagnosis not present

## 2022-06-13 DIAGNOSIS — E785 Hyperlipidemia, unspecified: Secondary | ICD-10-CM | POA: Diagnosis not present

## 2022-06-13 DIAGNOSIS — I700 Atherosclerosis of aorta: Secondary | ICD-10-CM | POA: Diagnosis not present

## 2022-06-13 DIAGNOSIS — I10 Essential (primary) hypertension: Secondary | ICD-10-CM | POA: Diagnosis not present

## 2022-06-13 DIAGNOSIS — E039 Hypothyroidism, unspecified: Secondary | ICD-10-CM | POA: Diagnosis not present

## 2022-06-13 DIAGNOSIS — Z0000 Encounter for general adult medical examination without abnormal findings: Secondary | ICD-10-CM | POA: Diagnosis not present

## 2022-06-13 DIAGNOSIS — D696 Thrombocytopenia, unspecified: Secondary | ICD-10-CM | POA: Diagnosis not present

## 2022-06-13 DIAGNOSIS — N400 Enlarged prostate without lower urinary tract symptoms: Secondary | ICD-10-CM | POA: Diagnosis not present

## 2022-06-13 DIAGNOSIS — R911 Solitary pulmonary nodule: Secondary | ICD-10-CM | POA: Diagnosis not present

## 2022-06-13 DIAGNOSIS — J449 Chronic obstructive pulmonary disease, unspecified: Secondary | ICD-10-CM | POA: Diagnosis not present

## 2022-06-13 DIAGNOSIS — R031 Nonspecific low blood-pressure reading: Secondary | ICD-10-CM | POA: Diagnosis not present

## 2022-06-15 ENCOUNTER — Other Ambulatory Visit: Payer: Self-pay | Admitting: *Deleted

## 2022-06-15 DIAGNOSIS — D649 Anemia, unspecified: Secondary | ICD-10-CM

## 2022-06-15 NOTE — Progress Notes (Signed)
Review of labs from [place]. Per Dr. Truett Perna: add CMP to next lab

## 2022-07-04 ENCOUNTER — Ambulatory Visit: Payer: Medicare PPO | Admitting: Urology

## 2022-07-04 ENCOUNTER — Encounter: Payer: Self-pay | Admitting: Urology

## 2022-07-04 VITALS — BP 111/61 | HR 91

## 2022-07-04 DIAGNOSIS — R3915 Urgency of urination: Secondary | ICD-10-CM | POA: Diagnosis not present

## 2022-07-04 DIAGNOSIS — R972 Elevated prostate specific antigen [PSA]: Secondary | ICD-10-CM | POA: Diagnosis not present

## 2022-07-04 DIAGNOSIS — N401 Enlarged prostate with lower urinary tract symptoms: Secondary | ICD-10-CM

## 2022-07-04 LAB — URINALYSIS, ROUTINE W REFLEX MICROSCOPIC
Bilirubin, UA: NEGATIVE
Ketones, UA: NEGATIVE
Leukocytes,UA: NEGATIVE
Nitrite, UA: NEGATIVE
RBC, UA: NEGATIVE
Specific Gravity, UA: 1.015 (ref 1.005–1.030)
Urobilinogen, Ur: 1 mg/dL (ref 0.2–1.0)
pH, UA: 5.5 (ref 5.0–7.5)

## 2022-07-04 LAB — MICROSCOPIC EXAMINATION: Bacteria, UA: NONE SEEN

## 2022-07-04 MED ORDER — TAMSULOSIN HCL 0.4 MG PO CAPS: 0.4000 mg | ORAL_CAPSULE | Freq: Two times a day (BID) | ORAL | 3 refills | Status: DC

## 2022-07-04 NOTE — Patient Instructions (Signed)

## 2022-07-04 NOTE — Progress Notes (Signed)
07/04/2022 10:12 AM   Karna Christmas Dec 07, 1945 [id number]  Referring provider: Darrow Bussing, MD [address] [place],  [place] [address]  Followup BPh and elevated PSA   HPI: Mr Nunn is a 76yo here for followup for elevated PSA and BPH. PSA increased to 5.2 from 4.4 in 12/2021. IPSS 14 QOL 2 on flomax 0.4mg  BID. No straining to urinate. Nocturia 2-3x. Mild urinary hesitancy.    PMH: Past Medical History:  Diagnosis Date   CKD (chronic kidney disease)    DOE (dyspnea on exertion)    Dysuria    Emphysema of lung (HCC)    History of throat cancer    Hyperlipidemia    Hypertension     Surgical History: No past surgical history on file.  Home Medications:  Allergies as of 07/04/2022   No Known Allergies      Medication List        Accurate as of July 04, 2022 10:12 AM. If you have any questions, ask your nurse or doctor.          Farxiga 10 MG Tabs tablet Generic drug: dapagliflozin propanediol Take 10 mg by mouth daily.   levothyroxine 137 MCG tablet Commonly known as: SYNTHROID Take 137 mcg by mouth daily.   lisinopril 40 MG tablet Commonly known as: ZESTRIL Take 40 mg by mouth daily.   meloxicam 15 MG tablet Commonly known as: MOBIC Take 15 mg by mouth daily.   omeprazole 20 MG capsule Commonly known as: PRILOSEC Take 20 mg by mouth 2 (two) times daily.   rosuvastatin 40 MG tablet Commonly known as: CRESTOR Take 40 mg by mouth daily.   tamsulosin 0.4 MG Caps capsule Commonly known as: FLOMAX Take 1 capsule (0.4 mg total) by mouth 2 (two) times daily.   Ventolin HFA 108 (90 Base) MCG/ACT inhaler Generic drug: albuterol Take 2 puffs by mouth as needed. As needed   VITAMIN D2 PO Take 800 Units by mouth daily.        Allergies: No Known Allergies  Family History: Family History  Problem Relation Age of Onset   Cancer Father     Social History:  reports that he has quit smoking. He has never used smokeless  tobacco. He reports that he does not drink alcohol and does not use drugs.  ROS: All other review of systems were reviewed and are negative except what is noted above in HPI  Physical Exam: BP 111/61   Pulse 91   Constitutional:  Alert and oriented, No acute distress. HEENT: [place] AT, moist mucus membranes.  Trachea midline, no masses. Cardiovascular: No clubbing, cyanosis, or edema. Respiratory: Normal respiratory effort, no increased work of breathing. GI: Abdomen is soft, nontender, nondistended, no abdominal masses GU: No CVA tenderness.  Lymph: No cervical or inguinal lymphadenopathy. Skin: No rashes, bruises or suspicious lesions. Neurologic: Grossly intact, no focal deficits, moving all 4 extremities. Psychiatric: Normal mood and affect.  Laboratory Data: Lab Results  Component Value Date   WBC 2.2 (L) 01/11/2022   HGB 13.4 01/11/2022   HCT 38.6 (L) 01/11/2022   MCV 100.0 01/11/2022   PLT 113 (L) 01/11/2022    Lab Results  Component Value Date   CREATININE 1.04 01/30/2021    No results found for: "PSA"  No results found for: "TESTOSTERONE"  No results found for: "HGBA1C"  Urinalysis    Component Value Date/Time   COLORURINE YELLOW 04/22/2016 2339   APPEARANCEUR Clear 12/29/2021 1028   LABSPEC  1.023 04/22/2016 2339   PHURINE 5.0 04/22/2016 2339   GLUCOSEU 3+ (A) 12/29/2021 1028   HGBUR MODERATE (A) 04/22/2016 2339   BILIRUBINUR Negative 12/29/2021 1028   KETONESUR 5 (A) 04/22/2016 2339   PROTEINUR 2+ (A) 12/29/2021 1028   PROTEINUR 30 (A) 04/22/2016 2339   UROBILINOGEN 0.2 11/20/2019 0859   NITRITE Negative 12/29/2021 1028   NITRITE POSITIVE (A) 04/22/2016 2339   LEUKOCYTESUR Negative 12/29/2021 1028    Lab Results  Component Value Date   LABMICR See below: 12/29/2021   WBCUA None seen 12/29/2021   LABEPIT None seen 12/29/2021   MUCUS Present 12/30/2020   BACTERIA None seen 12/29/2021    Pertinent Imaging:  No results found for this or any  previous visit.  No results found for this or any previous visit.  No results found for this or any previous visit.  No results found for this or any previous visit.  Results for orders placed during the hospital encounter of 05/31/16  US Renal  Narrative CLINICAL DATA:  Stage III chronic kidney disease, history hypertension, throat cancer, former smoker  EXAM: RENAL / URINARY TRACT ULTRASOUND COMPLETE  COMPARISON:  None  FINDINGS: Right Kidney:  Length: 11.6 cm. Mild age-related cortical thinning. Normal cortical echogenicity. No mass, hydronephrosis or shadowing calcification.  Left Kidney:  Length: 11.6 cm. Normal cortical echogenicity. Mild age-related renal cortical thinning. Small cystic lesion at upper pole 14 x 8 x 10 mm. No additional renal mass, hydronephrosis or shadowing calcification.  Bladder:  Normal appearance. BILATERAL ureteral jets visualized. Prostate gland appears enlarged 4.6 cm transverse by 2.8 cm AP, length inadequately establish but likely at least 4.4 cm.  IMPRESSION: Small LEFT renal cyst.  Age-related renal cortical thinning.  Prostatic enlargement.   Electronically Signed By: Ulyses Southward M.D. On: 05/31/2016 16:23  No valid procedures specified. No results found for this or any previous visit.  No results found for this or any previous visit.   Assessment & Plan:    1. Elevated PSA Psa today - Urinalysis, Routine w reflex microscopic  2. Benign localized prostatic hyperplasia with lower urinary tract symptoms (LUTS) Continue flomax 0.4mg  bid  3. Urinary urgency -continue flomax 0.4mg  BID   No follow-ups on file.  Wilkie Aye, MD  [facility] [place]

## 2022-07-05 DIAGNOSIS — I10 Essential (primary) hypertension: Secondary | ICD-10-CM | POA: Diagnosis not present

## 2022-07-05 DIAGNOSIS — R972 Elevated prostate specific antigen [PSA]: Secondary | ICD-10-CM | POA: Diagnosis not present

## 2022-07-05 DIAGNOSIS — N289 Disorder of kidney and ureter, unspecified: Secondary | ICD-10-CM | POA: Diagnosis not present

## 2022-07-05 LAB — PSA, TOTAL AND FREE
PSA, Free Pct: 33.8 %
PSA, Free: 2.03 ng/mL
Prostate Specific Ag, Serum: 6 ng/mL — ABNORMAL HIGH (ref 0.0–4.0)

## 2022-07-10 ENCOUNTER — Telehealth: Payer: Self-pay

## 2022-07-10 DIAGNOSIS — R972 Elevated prostate specific antigen [PSA]: Secondary | ICD-10-CM

## 2022-07-10 NOTE — Telephone Encounter (Signed)
Patient's wife called advising that they were concerned with the recent PSA levels from patients last appointment. Patient's wife advised that they were expecting a call from last week and wanted to follow up to see what the next steps were.

## 2022-07-11 NOTE — Telephone Encounter (Signed)
Patient/wife called in today concern about the rise in patient's PSA levels. Patient states that there were refer from Primary care Physician for PSA levels of 4.0 in October, May 5.2 and recent PSA labs in our office of 6.0. Patient/Wife is concern in the increase of PSA labs and will like to know what the next steps are. Patient/wife is aware that a message will be sent to Dr. Ronne Binning and someone will reach back. Patient/wife voiced understanding

## 2022-07-12 MED ORDER — LEVOFLOXACIN 750 MG PO TABS: 750.0000 mg | ORAL_TABLET | Freq: Every day | ORAL | 0 refills | Status: DC

## 2022-07-12 MED ORDER — LEVOFLOXACIN 750 MG PO TABS: 750.0000 mg | ORAL_TABLET | Freq: Once | ORAL | 0 refills | Status: DC

## 2022-07-12 NOTE — Telephone Encounter (Signed)
Spoke with patients wife concerning prostate biopsy.  Biopsy instructions went over with patients wife via phone and sent via my chart at her request.  Orders placed and antibiotic sent to pharmacy. Biopsy scheduled along with biopsy talk.

## 2022-07-12 NOTE — Telephone Encounter (Signed)
Return call to patient's wife making both patient and wife aware of Dr. Ronne Binning recommendation. Patient/wife  agree to prostate biopsy of Jul 25, 2022. Patient/wife voiced understanding  See MD responses below. I am still unable to scheduled biopsy. I have put orders in and patient/wife need biopsy instructions.Ronne Binning, Mardene Celeste, MD  Christoper Fabian R, CMA Caller: Unspecified (2 days ago, 10:19 AM) He should have a prostate biopsy

## 2022-08-08 ENCOUNTER — Ambulatory Visit (HOSPITAL_COMMUNITY)
Admission: RE | Admit: 2022-08-08 | Discharge: 2022-08-08 | Disposition: A | Discharge status: Home / Self Care | Payer: Medicare PPO | Source: Ambulatory Visit | Attending: Urology | Admitting: Urology

## 2022-08-08 ENCOUNTER — Encounter (HOSPITAL_COMMUNITY): Payer: Self-pay

## 2022-08-08 ENCOUNTER — Encounter: Payer: Self-pay | Admitting: Urology

## 2022-08-08 ENCOUNTER — Ambulatory Visit (HOSPITAL_BASED_OUTPATIENT_CLINIC_OR_DEPARTMENT_OTHER): Payer: Medicare PPO | Admitting: Urology

## 2022-08-08 ENCOUNTER — Other Ambulatory Visit: Payer: Self-pay | Admitting: Urology

## 2022-08-08 VITALS — BP 141/78 | HR 82 | Temp 97.9°F | Resp 18

## 2022-08-08 DIAGNOSIS — R972 Elevated prostate specific antigen [PSA]: Secondary | ICD-10-CM

## 2022-08-08 MED ORDER — GENTAMICIN SULFATE 40 MG/ML IJ SOLN
80.0000 mg | Freq: Once | INTRAMUSCULAR | Status: AC
  Administered 2022-08-08: 80 mg via INTRAMUSCULAR

## 2022-08-08 MED ORDER — LIDOCAINE HCL (PF) 2 % IJ SOLN
INTRAMUSCULAR | Status: AC
  Filled 2022-08-08: qty 10

## 2022-08-08 MED ORDER — GENTAMICIN SULFATE 40 MG/ML IJ SOLN
INTRAMUSCULAR | Status: AC
  Filled 2022-08-08: qty 2

## 2022-08-08 MED ORDER — LIDOCAINE HCL (PF) 2 % IJ SOLN
10.0000 mL | Freq: Once | INTRAMUSCULAR | Status: AC
  Administered 2022-08-08: 10 mL

## 2022-08-08 NOTE — Progress Notes (Signed)
PT tolerated prostate biopsy procedure and antibiotic injection well today. Labs obtained and sent for pathology by [facility] from ultrasound. PT ambulatory at discharge with no acute distress noted and verbalized understanding of discharge instructions. PT to follow up with urologist as scheduled on 08/22/22.

## 2022-08-08 NOTE — Patient Instructions (Signed)

## 2022-08-08 NOTE — Progress Notes (Signed)
Prostate Biopsy Procedure   Informed consent was obtained after discussing risks/benefits of the procedure.  A time out was performed to ensure correct patient identity.  Pre-Procedure: - Last PSA Level: No results found for: "PSA" - Gentamicin given prophylactically - Levaquin 500 mg administered PO -Transrectal Ultrasound performed revealing a 75.3 gm prostate -No significant hypoechoic or median lobe noted  Procedure: - Prostate block performed using 10 cc 1% lidocaine and biopsies taken from sextant areas, a total of 12 under ultrasound guidance.  Post-Procedure: - Patient tolerated the procedure well - He was counseled to seek immediate medical attention if experiences any severe pain, significant bleeding, or fevers - Return in one week to discuss biopsy results

## 2022-08-09 ENCOUNTER — Telehealth: Payer: Self-pay

## 2022-08-09 NOTE — Telephone Encounter (Signed)
Patient made aware of negative prostate biopsy and new 6 month ov with psa appointment.

## 2022-08-17 ENCOUNTER — Inpatient Hospital Stay: Payer: Medicare PPO | Attending: Oncology

## 2022-08-17 ENCOUNTER — Inpatient Hospital Stay: Payer: Medicare PPO | Admitting: Oncology

## 2022-08-17 VITALS — BP 114/64 | HR 79 | Temp 98.1°F | Resp 18 | Ht 71.0 in | Wt 201.0 lb

## 2022-08-17 DIAGNOSIS — Z8501 Personal history of malignant neoplasm of esophagus: Secondary | ICD-10-CM | POA: Diagnosis not present

## 2022-08-17 DIAGNOSIS — D696 Thrombocytopenia, unspecified: Secondary | ICD-10-CM | POA: Diagnosis not present

## 2022-08-17 DIAGNOSIS — D649 Anemia, unspecified: Secondary | ICD-10-CM | POA: Diagnosis not present

## 2022-08-17 LAB — CBC WITH DIFFERENTIAL (CANCER CENTER ONLY)
Abs Immature Granulocytes: 0 10*3/uL (ref 0.00–0.07)
Basophils Absolute: 0 10*3/uL (ref 0.0–0.1)
Basophils Relative: 1 %
Eosinophils Absolute: 0.1 10*3/uL (ref 0.0–0.5)
Eosinophils Relative: 3 %
HCT: 35.2 % — ABNORMAL LOW (ref 39.0–52.0)
Hemoglobin: 12.4 g/dL — ABNORMAL LOW (ref 13.0–17.0)
Immature Granulocytes: 0 %
Lymphocytes Relative: 32 %
Lymphs Abs: 0.6 10*3/uL — ABNORMAL LOW (ref 0.7–4.0)
MCH: 34.3 pg — ABNORMAL HIGH (ref 26.0–34.0)
MCHC: 35.2 g/dL (ref 30.0–36.0)
MCV: 97.5 fL (ref 80.0–100.0)
Monocytes Absolute: 0.1 10*3/uL (ref 0.1–1.0)
Monocytes Relative: 7 %
Neutro Abs: 1 10*3/uL — ABNORMAL LOW (ref 1.7–7.7)
Neutrophils Relative %: 57 %
Platelet Count: 111 10*3/uL — ABNORMAL LOW (ref 150–400)
RBC: 3.61 MIL/uL — ABNORMAL LOW (ref 4.22–5.81)
RDW: 14.2 % (ref 11.5–15.5)
WBC Count: 1.8 10*3/uL — ABNORMAL LOW (ref 4.0–10.5)
nRBC: 0 % (ref 0.0–0.2)

## 2022-08-17 LAB — CMP (CANCER CENTER ONLY)
ALT: 13 U/L (ref 0–44)
AST: 13 U/L — ABNORMAL LOW (ref 15–41)
Albumin: 4.4 g/dL (ref 3.5–5.0)
Alkaline Phosphatase: 45 U/L (ref 38–126)
Anion gap: 7 (ref 5–15)
BUN: 27 mg/dL — ABNORMAL HIGH (ref 8–23)
CO2: 25 mmol/L (ref 22–32)
Calcium: 9.6 mg/dL (ref 8.9–10.3)
Chloride: 107 mmol/L (ref 98–111)
Creatinine: 1.19 mg/dL (ref 0.61–1.24)
GFR, Estimated: >60 mL/min (ref >60)
Glucose, Bld: 122 mg/dL — ABNORMAL HIGH (ref 70–99)
Potassium: 4.4 mmol/L (ref 3.5–5.1)
Sodium: 139 mmol/L (ref 135–145)
Total Bilirubin: 0.6 mg/dL (ref 0.3–1.2)
Total Protein: 7 g/dL (ref 6.5–8.1)

## 2022-08-17 NOTE — Progress Notes (Signed)
  [facility] OFFICE PROGRESS NOTE   Diagnosis: Leukopenia, thrombocytopenia  INTERVAL HISTORY:   Vincent Coleman returns at scheduled.  He feels well.  No fever, night sweats, or recent infection.  He bruises over the arms.  No other bleeding.  He hits golf ball several days per week.  He has decreased his alcohol intake to 4 beers 1 day/week.  He drank more often in past years.  Objective:  Vital signs in last 24 hours:  Blood pressure 114/64, pulse 79, temperature 98.1 F (36.7 C), temperature source Oral, resp. rate 18, height 5\' 11"  (1.803 m), weight 201 lb (91.2 kg), SpO2 98 %.    Lymphatics: No cervical, supraclavicular, axillary, or inguinal nodes Resp: Bilateral end inspiratory/expiratory wheeze, no respiratory distress Cardio: Regular rate and rhythm GI: No hepatosplenomegaly, no apparent ascites Vascular: No leg edema  Skin: Ecchymoses over the lower arms bilaterally, few telangiectasias at the upper chest   Lab Results:  Lab Results  Component Value Date   WBC 1.8 (L) 08/17/2022   HGB 12.4 (L) 08/17/2022   HCT 35.2 (L) 08/17/2022   MCV 97.5 08/17/2022   PLT 111 (L) 08/17/2022   NEUTROABS 1.0 (L) 08/17/2022    CMP  Lab Results  Component Value Date   NA 140 01/30/2021   K 3.4 (L) 01/30/2021   CL 106 01/30/2021   CO2 22 01/30/2021   GLUCOSE 105 (H) 01/30/2021   BUN 23 01/30/2021   CREATININE 1.04 01/30/2021   CALCIUM 9.6 01/30/2021   PROT 7.2 01/30/2021   ALBUMIN 4.7 01/30/2021   AST 18 01/30/2021   ALT 19 01/30/2021   ALKPHOS 47 01/30/2021   BILITOT 0.5 01/30/2021   GFRNONAA >60 01/30/2021   GFRAA >60 04/25/2016    No results found for: "CEA1", "CEA", "CAN199", "CA125"  Lab Results  Component Value Date   INR 1.05 04/22/2016   LABPROT 13.8 04/22/2016    Imaging:  No results found.  Medications: I have reviewed the patient's current medications.   Assessment/Plan: Leukopenia, thrombocytopenia, and Red cell  macrocytosis  Chronic renal insufficiency  3.   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   Remote history of heavy tobacco use  5.   Current and chronic history of alcohol use  6.   Status post left lung surgery for removal of a "a lung granuloma ".  7.   BPH  8.   Admission in February 2018 with urosepsis    Disposition: [name] has chronic leukopenia/thrombocytopenia.  The neutrophil count and hemoglobin are lower today.  The hematologic findings are most likely secondary to chronic liver disease or myelodysplasia.  He will return for an office visit and CBC in 3 months.  We will refer him for a diagnostic bone marrow biopsy and further evaluation for liver disease if he develops progressive cytopenias.  He will seek medical attention for bleeding or symptoms of an infection.  Thornton Papas, MD  08/17/2022  8:50 AM

## 2022-08-22 ENCOUNTER — Ambulatory Visit: Payer: Medicare PPO | Admitting: Urology

## 2022-09-20 DIAGNOSIS — N1831 Chronic kidney disease, stage 3a: Secondary | ICD-10-CM | POA: Diagnosis not present

## 2022-09-26 DIAGNOSIS — N400 Enlarged prostate without lower urinary tract symptoms: Secondary | ICD-10-CM | POA: Diagnosis not present

## 2022-09-26 DIAGNOSIS — N2581 Secondary hyperparathyroidism of renal origin: Secondary | ICD-10-CM | POA: Diagnosis not present

## 2022-09-26 DIAGNOSIS — D72819 Decreased white blood cell count, unspecified: Secondary | ICD-10-CM | POA: Diagnosis not present

## 2022-09-26 DIAGNOSIS — R809 Proteinuria, unspecified: Secondary | ICD-10-CM | POA: Diagnosis not present

## 2022-09-26 DIAGNOSIS — I129 Hypertensive chronic kidney disease with stage 1 through stage 4 chronic kidney disease, or unspecified chronic kidney disease: Secondary | ICD-10-CM | POA: Diagnosis not present

## 2022-09-26 DIAGNOSIS — N1831 Chronic kidney disease, stage 3a: Secondary | ICD-10-CM | POA: Diagnosis not present

## 2022-10-29 DIAGNOSIS — L821 Other seborrheic keratosis: Secondary | ICD-10-CM | POA: Diagnosis not present

## 2022-10-29 DIAGNOSIS — D225 Melanocytic nevi of trunk: Secondary | ICD-10-CM | POA: Diagnosis not present

## 2022-10-29 DIAGNOSIS — D692 Other nonthrombocytopenic purpura: Secondary | ICD-10-CM | POA: Diagnosis not present

## 2022-10-29 DIAGNOSIS — L570 Actinic keratosis: Secondary | ICD-10-CM | POA: Diagnosis not present

## 2022-10-29 DIAGNOSIS — L814 Other melanin hyperpigmentation: Secondary | ICD-10-CM | POA: Diagnosis not present

## 2022-10-29 DIAGNOSIS — Z85828 Personal history of other malignant neoplasm of skin: Secondary | ICD-10-CM | POA: Diagnosis not present

## 2022-10-29 DIAGNOSIS — C44519 Basal cell carcinoma of skin of other part of trunk: Secondary | ICD-10-CM | POA: Diagnosis not present

## 2022-10-30 ENCOUNTER — Ambulatory Visit (HOSPITAL_COMMUNITY)
Admission: RE | Admit: 2022-10-30 | Discharge: 2022-10-30 | Disposition: A | Discharge status: Home / Self Care | Payer: Medicare PPO | Source: Ambulatory Visit | Attending: Cardiovascular Disease | Admitting: Cardiovascular Disease

## 2022-10-30 DIAGNOSIS — I6523 Occlusion and stenosis of bilateral carotid arteries: Secondary | ICD-10-CM | POA: Diagnosis not present

## 2022-10-31 ENCOUNTER — Other Ambulatory Visit: Payer: Self-pay | Admitting: *Deleted

## 2022-10-31 DIAGNOSIS — I6523 Occlusion and stenosis of bilateral carotid arteries: Secondary | ICD-10-CM

## 2022-11-15 ENCOUNTER — Inpatient Hospital Stay: Payer: Medicare PPO | Attending: Oncology | Admitting: Oncology

## 2022-11-15 ENCOUNTER — Inpatient Hospital Stay: Payer: Medicare PPO

## 2022-11-15 VITALS — BP 138/64 | HR 80 | Temp 98.1°F | Resp 18 | Ht 71.0 in | Wt 200.1 lb

## 2022-11-15 DIAGNOSIS — D72819 Decreased white blood cell count, unspecified: Secondary | ICD-10-CM | POA: Insufficient documentation

## 2022-11-15 DIAGNOSIS — Z23 Encounter for immunization: Secondary | ICD-10-CM

## 2022-11-15 DIAGNOSIS — D696 Thrombocytopenia, unspecified: Secondary | ICD-10-CM | POA: Diagnosis not present

## 2022-11-15 DIAGNOSIS — D649 Anemia, unspecified: Secondary | ICD-10-CM | POA: Diagnosis not present

## 2022-11-15 DIAGNOSIS — N401 Enlarged prostate with lower urinary tract symptoms: Secondary | ICD-10-CM | POA: Insufficient documentation

## 2022-11-15 DIAGNOSIS — N189 Chronic kidney disease, unspecified: Secondary | ICD-10-CM | POA: Diagnosis not present

## 2022-11-15 LAB — CMP (CANCER CENTER ONLY)
ALT: 20 U/L (ref 0–44)
AST: 22 U/L (ref 15–41)
Albumin: 4.4 g/dL (ref 3.5–5.0)
Alkaline Phosphatase: 49 U/L (ref 38–126)
Anion gap: 9 (ref 5–15)
BUN: 23 mg/dL (ref 8–23)
CO2: 25 mmol/L (ref 22–32)
Calcium: 9.2 mg/dL (ref 8.9–10.3)
Chloride: 106 mmol/L (ref 98–111)
Creatinine: 1.23 mg/dL (ref 0.61–1.24)
GFR, Estimated: >60 mL/min (ref >60)
Glucose, Bld: 113 mg/dL — ABNORMAL HIGH (ref 70–99)
Potassium: 4.1 mmol/L (ref 3.5–5.1)
Sodium: 140 mmol/L (ref 135–145)
Total Bilirubin: 0.5 mg/dL (ref 0.3–1.2)
Total Protein: 7 g/dL (ref 6.5–8.1)

## 2022-11-15 LAB — CBC WITH DIFFERENTIAL (CANCER CENTER ONLY)
Abs Immature Granulocytes: 0.01 10*3/uL (ref 0.00–0.07)
Basophils Absolute: 0 10*3/uL (ref 0.0–0.1)
Basophils Relative: 1 %
Eosinophils Absolute: 0 10*3/uL (ref 0.0–0.5)
Eosinophils Relative: 2 %
HCT: 40.5 % (ref 39.0–52.0)
Hemoglobin: 14.4 g/dL (ref 13.0–17.0)
Immature Granulocytes: 1 %
Lymphocytes Relative: 35 %
Lymphs Abs: 0.6 10*3/uL — ABNORMAL LOW (ref 0.7–4.0)
MCH: 33.6 pg (ref 26.0–34.0)
MCHC: 35.6 g/dL (ref 30.0–36.0)
MCV: 94.6 fL (ref 80.0–100.0)
Monocytes Absolute: 0.1 10*3/uL (ref 0.1–1.0)
Monocytes Relative: 6 %
Neutro Abs: 1 10*3/uL — ABNORMAL LOW (ref 1.7–7.7)
Neutrophils Relative %: 55 %
Platelet Count: 93 10*3/uL — ABNORMAL LOW (ref 150–400)
RBC: 4.28 MIL/uL (ref 4.22–5.81)
RDW: 14.6 % (ref 11.5–15.5)
WBC Count: 1.7 10*3/uL — ABNORMAL LOW (ref 4.0–10.5)
nRBC: 0 % (ref 0.0–0.2)

## 2022-11-15 MED ORDER — INFLUENZA VAC A&B SA ADJ QUAD 0.5 ML IM PRSY: 0.5000 mL | PREFILLED_SYRINGE | Freq: Once | INTRAMUSCULAR | Status: DC

## 2022-11-15 MED ORDER — INFLUENZA VAC A&B SURF ANT ADJ 0.5 ML IM SUSY
0.5000 mL | PREFILLED_SYRINGE | INTRAMUSCULAR | Status: AC
  Administered 2022-11-15: 0.5 mL via INTRAMUSCULAR
  Filled 2022-11-15: qty 0.5

## 2022-11-15 NOTE — Progress Notes (Signed)
  [facility] OFFICE PROGRESS NOTE   Diagnosis: Leukopenia, thrombocytopenia  INTERVAL HISTORY:   Vincent Coleman returns as scheduled.  He feels well.  Good appetite.  He has intermittent urinary incontinence.  No bleeding.  No fever or night sweats.  No recent infection.  He bruises easily over the arms.  Objective:  Vital signs in last 24 hours:  Blood pressure 138/64, pulse 80, temperature 98.1 F (36.7 C), temperature source Oral, resp. rate 18, height 5\' 11"  (1.803 m), weight 200 lb 1.6 oz (90.8 kg), SpO2 96%.     Lymphatics: No cervical, supraclavicular, axillary, or inguinal nodes Resp: Lungs clear bilaterally Cardio: Regular rate and rhythm GI: No hepatosplenomegaly, no mass, nontender, no apparent ascites Vascular: No leg edema  Skin: Ecchymoses at the forearm bilaterally   Lab Results:  Lab Results  Component Value Date   WBC 1.7 (L) 11/15/2022   HGB 14.4 11/15/2022   HCT 40.5 11/15/2022   MCV 94.6 11/15/2022   PLT 93 (L) 11/15/2022   NEUTROABS 1.0 (L) 11/15/2022    CMP  Lab Results  Component Value Date   NA 140 11/15/2022   K 4.1 11/15/2022   CL 106 11/15/2022   CO2 25 11/15/2022   GLUCOSE 113 (H) 11/15/2022   BUN 23 11/15/2022   CREATININE 1.23 11/15/2022   CALCIUM 9.2 11/15/2022   PROT 7.0 11/15/2022   ALBUMIN 4.4 11/15/2022   AST 22 11/15/2022   ALT 20 11/15/2022   ALKPHOS 49 11/15/2022   BILITOT 0.5 11/15/2022   GFRNONAA >60 11/15/2022   GFRAA >60 04/25/2016     Medications: I have reviewed the patient's current medications.   Assessment/Plan:  Leukopenia, thrombocytopenia, and Red cell macrocytosis  Chronic renal insufficiency  3.   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   Remote history of heavy tobacco use  5.   Current and chronic history of alcohol use  6.   Status post left lung surgery for removal of a "a lung granuloma  ".  7.   BPH  8.   Admission in February 2018 with urosepsis     Disposition: Vincent Coleman has chronic leukopenia/thrombocytopenia.  A myeloma panel was negative in 2022.  The leukopenia/thrombocytopenia may be related to alcohol use and chronic liver disease.  The differential diagnosis includes myelodysplasia.  The plan is to continue observation.  He will return for an office and lab visit in 4 months.  He will be referred for a bone marrow biopsy if he develops a progressive hematologic abnormality.  Thornton Papas, MD  11/15/2022  8:59 AM

## 2022-11-16 ENCOUNTER — Ambulatory Visit: Payer: Medicare PPO | Admitting: Oncology

## 2022-11-16 LAB — KAPPA/LAMBDA LIGHT CHAINS
Kappa free light chain: 19 mg/L (ref 3.3–19.4)
Kappa, lambda light chain ratio: 1.57 (ref 0.26–1.65)
Lambda free light chains: 12.1 mg/L (ref 5.7–26.3)

## 2022-12-13 ENCOUNTER — Other Ambulatory Visit: Payer: Self-pay | Admitting: Family Medicine

## 2022-12-13 DIAGNOSIS — M109 Gout, unspecified: Secondary | ICD-10-CM | POA: Diagnosis not present

## 2022-12-13 DIAGNOSIS — R490 Dysphonia: Secondary | ICD-10-CM | POA: Diagnosis not present

## 2022-12-13 DIAGNOSIS — I10 Essential (primary) hypertension: Secondary | ICD-10-CM | POA: Diagnosis not present

## 2022-12-13 DIAGNOSIS — E039 Hypothyroidism, unspecified: Secondary | ICD-10-CM | POA: Diagnosis not present

## 2022-12-13 DIAGNOSIS — R911 Solitary pulmonary nodule: Secondary | ICD-10-CM | POA: Diagnosis not present

## 2022-12-13 DIAGNOSIS — J449 Chronic obstructive pulmonary disease, unspecified: Secondary | ICD-10-CM | POA: Diagnosis not present

## 2022-12-13 DIAGNOSIS — R7309 Other abnormal glucose: Secondary | ICD-10-CM | POA: Diagnosis not present

## 2022-12-19 ENCOUNTER — Ambulatory Visit
Admission: RE | Admit: 2022-12-19 | Discharge: 2022-12-19 | Disposition: A | Discharge status: Home / Self Care | Payer: Medicare PPO | Source: Ambulatory Visit | Attending: Family Medicine | Admitting: Family Medicine

## 2022-12-19 DIAGNOSIS — R490 Dysphonia: Secondary | ICD-10-CM

## 2022-12-25 DIAGNOSIS — H35371 Puckering of macula, right eye: Secondary | ICD-10-CM | POA: Diagnosis not present

## 2022-12-28 ENCOUNTER — Other Ambulatory Visit: Payer: Medicare PPO

## 2023-01-04 ENCOUNTER — Ambulatory Visit: Payer: Medicare PPO | Admitting: Urology

## 2023-01-07 DIAGNOSIS — R490 Dysphonia: Secondary | ICD-10-CM | POA: Diagnosis not present

## 2023-01-31 NOTE — Telephone Encounter (Signed)
Telephone call  

## 2023-02-01 ENCOUNTER — Other Ambulatory Visit: Payer: Medicare PPO

## 2023-02-06 ENCOUNTER — Ambulatory Visit: Payer: Medicare PPO | Admitting: Urology

## 2023-02-08 ENCOUNTER — Ambulatory Visit: Payer: Medicare PPO | Admitting: Urology

## 2023-03-18 ENCOUNTER — Inpatient Hospital Stay: Payer: Medicare PPO | Attending: Oncology | Admitting: Oncology

## 2023-03-18 ENCOUNTER — Other Ambulatory Visit: Payer: Medicare PPO

## 2023-03-18 ENCOUNTER — Inpatient Hospital Stay: Payer: Medicare PPO

## 2023-03-18 VITALS — BP 113/59 | HR 92 | Temp 98.2°F | Resp 18 | Ht 71.0 in | Wt 202.2 lb

## 2023-03-18 DIAGNOSIS — D72819 Decreased white blood cell count, unspecified: Secondary | ICD-10-CM | POA: Diagnosis not present

## 2023-03-18 DIAGNOSIS — N400 Enlarged prostate without lower urinary tract symptoms: Secondary | ICD-10-CM | POA: Insufficient documentation

## 2023-03-18 DIAGNOSIS — D696 Thrombocytopenia, unspecified: Secondary | ICD-10-CM | POA: Diagnosis not present

## 2023-03-18 DIAGNOSIS — D709 Neutropenia, unspecified: Secondary | ICD-10-CM | POA: Diagnosis not present

## 2023-03-18 DIAGNOSIS — D7589 Other specified diseases of blood and blood-forming organs: Secondary | ICD-10-CM | POA: Diagnosis not present

## 2023-03-18 DIAGNOSIS — N189 Chronic kidney disease, unspecified: Secondary | ICD-10-CM | POA: Insufficient documentation

## 2023-03-18 DIAGNOSIS — D649 Anemia, unspecified: Secondary | ICD-10-CM

## 2023-03-18 LAB — CMP (CANCER CENTER ONLY)
ALT: 13 U/L (ref 0–44)
AST: 12 U/L — ABNORMAL LOW (ref 15–41)
Albumin: 4.3 g/dL (ref 3.5–5.0)
Alkaline Phosphatase: 49 U/L (ref 38–126)
Anion gap: 7 (ref 5–15)
BUN: 21 mg/dL (ref 8–23)
CO2: 27 mmol/L (ref 22–32)
Calcium: 9.4 mg/dL (ref 8.9–10.3)
Chloride: 107 mmol/L (ref 98–111)
Creatinine: 1.24 mg/dL (ref 0.61–1.24)
GFR, Estimated: 59 mL/min — ABNORMAL LOW (ref >60)
Glucose, Bld: 128 mg/dL — ABNORMAL HIGH (ref 70–99)
Potassium: 3.9 mmol/L (ref 3.5–5.1)
Sodium: 141 mmol/L (ref 135–145)
Total Bilirubin: 0.5 mg/dL (ref 0.0–1.2)
Total Protein: 7 g/dL (ref 6.5–8.1)

## 2023-03-18 LAB — CBC WITH DIFFERENTIAL (CANCER CENTER ONLY)
Abs Immature Granulocytes: 0 10*3/uL (ref 0.00–0.07)
Basophils Absolute: 0 10*3/uL (ref 0.0–0.1)
Basophils Relative: 1 %
Eosinophils Absolute: 0.1 10*3/uL (ref 0.0–0.5)
Eosinophils Relative: 5 %
HCT: 37.3 % — ABNORMAL LOW (ref 39.0–52.0)
Hemoglobin: 13.4 g/dL (ref 13.0–17.0)
Immature Granulocytes: 0 %
Lymphocytes Relative: 35 %
Lymphs Abs: 0.7 10*3/uL (ref 0.7–4.0)
MCH: 34.4 pg — ABNORMAL HIGH (ref 26.0–34.0)
MCHC: 35.9 g/dL (ref 30.0–36.0)
MCV: 95.6 fL (ref 80.0–100.0)
Monocytes Absolute: 0.1 10*3/uL (ref 0.1–1.0)
Monocytes Relative: 5 %
Neutro Abs: 1.1 10*3/uL — ABNORMAL LOW (ref 1.7–7.7)
Neutrophils Relative %: 54 %
Platelet Count: 110 10*3/uL — ABNORMAL LOW (ref 150–400)
RBC: 3.9 MIL/uL — ABNORMAL LOW (ref 4.22–5.81)
RDW: 12.9 % (ref 11.5–15.5)
WBC Count: 2 10*3/uL — ABNORMAL LOW (ref 4.0–10.5)
nRBC: 0 % (ref 0.0–0.2)

## 2023-03-18 NOTE — Progress Notes (Signed)
  [facility] OFFICE PROGRESS NOTE   Diagnosis: Leukopenia/thrombocytopenia  INTERVAL HISTORY:   Vincent Coleman returns as scheduled.  He had upper respiratory infection last week with easel and chest congestion.  This has improved.  He bruises easily.  No other complaint.  He drinks beer 1 day/week.  Objective:  Vital signs in last 24 hours:  Blood pressure (!) 113/59, pulse 92, temperature 98.2 F (36.8 C), temperature source Temporal, resp. rate 18, height 5' 11 (1.803 m), weight 202 lb 3.2 oz (91.7 kg), SpO2 98%.    Lymphatics: No cervical, supraclavicular, axillary, or inguinal nodes Resp: Lungs clear bilaterally with prolonged expiratory phase, no respiratory distress Cardio: Regular rate and rhythm GI: No hepatosplenomegaly, no apparent ascites Vascular: No leg edema  Skin: Hyperpigmentation of the dorsum of the hands and forearms   Lab Results:  Lab Results  Component Value Date   WBC 2.0 (L) 03/18/2023   HGB 13.4 03/18/2023   HCT 37.3 (L) 03/18/2023   MCV 95.6 03/18/2023   PLT 110 (L) 03/18/2023   NEUTROABS 1.1 (L) 03/18/2023    CMP  Lab Results  Component Value Date   NA 141 03/18/2023   K 3.9 03/18/2023   CL 107 03/18/2023   CO2 27 03/18/2023   GLUCOSE 128 (H) 03/18/2023   BUN 21 03/18/2023   CREATININE 1.24 03/18/2023   CALCIUM  9.4 03/18/2023   PROT 7.0 03/18/2023   ALBUMIN 4.3 03/18/2023   AST 12 (L) 03/18/2023   ALT 13 03/18/2023   ALKPHOS 49 03/18/2023   BILITOT 0.5 03/18/2023   GFRNONAA 59 (L) 03/18/2023   GFRAA >60 04/25/2016    Medications: I have reviewed the patient's current medications.   Assessment/Plan: Leukopenia, thrombocytopenia, and Red cell macrocytosis  Chronic renal insufficiency  3.   Report of esophagus cancer diagnosed greater than 20 years ago and treated with local surgery followed by radiation  4.   Remote history of heavy tobacco use  5.   Current and chronic history of alcohol use  6.   Status  post left lung surgery for removal of a a lung granuloma .  7.   BPH  8.   Admission in February 2018 with urosepsis     Disposition: Vincent Coleman has chronic leukopenia/thrombocytopenia.  He has a history of heavy alcohol use in the past and currently drinks approximately 4 beers 1 day/week.  The hematologic findings are likely related to alcohol and chronic liver disease versus myelodysplasia.  He has a remote history of tobacco use and a chest CT in February 2023 revealed a left lower lobe nodule.  A 2-year follow-up was recommended.  We will order a chest CT.  I will also order a CT abdomen to look for evidence of cirrhosis.  Vincent Coleman will seek medical attention for symptoms of an infection.  He will return for an office and lab visit in 6 months.  I recommended he remain up-to-date on pneumonia vaccines.  Arley Hof, MD  03/18/2023  9:03 AM

## 2023-03-19 ENCOUNTER — Other Ambulatory Visit: Payer: Medicare PPO

## 2023-03-19 ENCOUNTER — Other Ambulatory Visit: Payer: Self-pay

## 2023-03-19 DIAGNOSIS — R972 Elevated prostate specific antigen [PSA]: Secondary | ICD-10-CM

## 2023-03-20 DIAGNOSIS — E039 Hypothyroidism, unspecified: Secondary | ICD-10-CM | POA: Diagnosis not present

## 2023-03-21 DIAGNOSIS — R972 Elevated prostate specific antigen [PSA]: Secondary | ICD-10-CM | POA: Diagnosis not present

## 2023-03-22 LAB — PSA, TOTAL AND FREE
PSA, Free Pct: 30 %
PSA, Free: 1.92 ng/mL
Prostate Specific Ag, Serum: 6.4 ng/mL — ABNORMAL HIGH (ref 0.0–4.0)

## 2023-03-25 ENCOUNTER — Ambulatory Visit: Payer: Medicare PPO | Admitting: Urology

## 2023-03-25 VITALS — BP 121/64 | HR 82

## 2023-03-25 DIAGNOSIS — R972 Elevated prostate specific antigen [PSA]: Secondary | ICD-10-CM | POA: Diagnosis not present

## 2023-03-25 DIAGNOSIS — N401 Enlarged prostate with lower urinary tract symptoms: Secondary | ICD-10-CM | POA: Diagnosis not present

## 2023-03-25 DIAGNOSIS — R3915 Urgency of urination: Secondary | ICD-10-CM

## 2023-03-25 LAB — URINALYSIS, ROUTINE W REFLEX MICROSCOPIC
Bilirubin, UA: NEGATIVE
Ketones, UA: NEGATIVE
Leukocytes,UA: NEGATIVE
Nitrite, UA: NEGATIVE
RBC, UA: NEGATIVE
Specific Gravity, UA: 1.02 (ref 1.005–1.030)
Urobilinogen, Ur: 0.2 mg/dL (ref 0.2–1.0)
pH, UA: 6 (ref 5.0–7.5)

## 2023-03-25 LAB — MICROSCOPIC EXAMINATION: Bacteria, UA: NONE SEEN

## 2023-03-25 MED ORDER — TAMSULOSIN HCL 0.4 MG PO CAPS
0.4000 mg | ORAL_CAPSULE | Freq: Two times a day (BID) | ORAL | 3 refills | Status: DC
Start: 2023-03-25 — End: 2023-10-28

## 2023-03-25 NOTE — Progress Notes (Signed)
 03/25/2023 8:57 AM   Vincent Coleman 09-20-1945 [id number]  Referring provider: Regino Slater, MD [address] [place],  [place] [address]  Followup BPh   HPI: Vincent Coleman is a 77yo here for followup for elevated PSA and BPH. IPSS 18 QOl 3 on flomax  0.4mg  BID. Nocturia 2x. He has occasional urge incontinence. Urine stream strong. PSA increased to 6.4 from 6.0.    PMH: Past Medical History:  Diagnosis Date   CKD (chronic kidney disease)    DOE (dyspnea on exertion)    Dysuria    Emphysema of lung (HCC)    History of throat cancer    Hyperlipidemia    Hypertension     Surgical History: No past surgical history on file.  Home Medications:  Allergies as of 03/25/2023   No Known Allergies      Medication List        Accurate as of March 25, 2023  8:57 AM. If you have any questions, ask your nurse or doctor.          Farxiga 10 MG Tabs tablet Generic drug: dapagliflozin propanediol Take 10 mg by mouth daily.   levothyroxine  137 MCG tablet Commonly known as: SYNTHROID  Take 137 mcg by mouth daily.   omeprazole 20 MG capsule Commonly known as: PRILOSEC Take 20 mg by mouth 2 (two) times daily.   rosuvastatin  40 MG tablet Commonly known as: CRESTOR  Take 40 mg by mouth daily.   tamsulosin  0.4 MG Caps capsule Commonly known as: FLOMAX  Take 1 capsule (0.4 mg total) by mouth 2 (two) times daily.   VITAMIN D2 PO Take 800 Units by mouth daily.        Allergies: No Known Allergies  Family History: Family History  Problem Relation Age of Onset   Cancer Father     Social History:  reports that he has quit smoking. He has never used smokeless tobacco. He reports that he does not drink alcohol and does not use drugs.  ROS: All other review of systems were reviewed and are negative except what is noted above in HPI  Physical Exam: BP 121/64   Pulse 82   Constitutional:  Alert and oriented, No acute distress. HEENT: [place] AT, moist  mucus membranes.  Trachea midline, no masses. Cardiovascular: No clubbing, cyanosis, or edema. Respiratory: Normal respiratory effort, no increased work of breathing. GI: Abdomen is soft, nontender, nondistended, no abdominal masses GU: No CVA tenderness.  Lymph: No cervical or inguinal lymphadenopathy. Skin: No rashes, bruises or suspicious lesions. Neurologic: Grossly intact, no focal deficits, moving all 4 extremities. Psychiatric: Normal mood and affect.  Laboratory Data: Lab Results  Component Value Date   WBC 2.0 (L) 03/18/2023   HGB 13.4 03/18/2023   HCT 37.3 (L) 03/18/2023   MCV 95.6 03/18/2023   PLT 110 (L) 03/18/2023    Lab Results  Component Value Date   CREATININE 1.24 03/18/2023    No results found for: PSA  No results found for: TESTOSTERONE  No results found for: HGBA1C  Urinalysis    Component Value Date/Time   COLORURINE YELLOW 04/22/2016 2339   APPEARANCEUR Clear 07/04/2022 0950   LABSPEC 1.023 04/22/2016 2339   PHURINE 5.0 04/22/2016 2339   GLUCOSEU 3+ (A) 07/04/2022 0950   HGBUR MODERATE (A) 04/22/2016 2339   BILIRUBINUR Negative 07/04/2022 0950   KETONESUR 5 (A) 04/22/2016 2339   PROTEINUR 2+ (A) 07/04/2022 0950   PROTEINUR 30 (A) 04/22/2016 2339   UROBILINOGEN 0.2 11/20/2019 0859  NITRITE Negative 07/04/2022 0950   NITRITE POSITIVE (A) 04/22/2016 2339   LEUKOCYTESUR Negative 07/04/2022 0950    Lab Results  Component Value Date   LABMICR See below: 07/04/2022   WBCUA 0-5 07/04/2022   LABEPIT 0-10 07/04/2022   MUCUS Present 12/30/2020   BACTERIA None seen 07/04/2022    Pertinent Imaging:  No results found for this or any previous visit.  No results found for this or any previous visit.  No results found for this or any previous visit.  No results found for this or any previous visit.  Results for orders placed during the hospital encounter of 05/31/16  US  Renal  Narrative CLINICAL DATA:  Stage III chronic kidney  disease, history hypertension, throat cancer, former smoker  EXAM: RENAL / URINARY TRACT ULTRASOUND COMPLETE  COMPARISON:  None  FINDINGS: Right Kidney:  Length: 11.6 cm. Mild age-related cortical thinning. Normal cortical echogenicity. No mass, hydronephrosis or shadowing calcification.  Left Kidney:  Length: 11.6 cm. Normal cortical echogenicity. Mild age-related renal cortical thinning. Small cystic lesion at upper pole 14 x 8 x 10 mm. No additional renal mass, hydronephrosis or shadowing calcification.  Bladder:  Normal appearance. BILATERAL ureteral jets visualized. Prostate gland appears enlarged 4.6 cm transverse by 2.8 cm AP, length inadequately establish but likely at least 4.4 cm.  IMPRESSION: Small LEFT renal cyst.  Age-related renal cortical thinning.  Prostatic enlargement.   Electronically Signed By: Vincent Coleman M.D. On: 05/31/2016 16:23  No results found for this or any previous visit.  No results found for this or any previous visit.  No results found for this or any previous visit.   Assessment & Plan:    1. Elevated PSA (Primary) Followup 6 months with a PSA - Urinalysis, Routine w reflex microscopic  2. Benign localized prostatic hyperplasia with lower urinary tract symptoms (LUTS) Flomax  0.4mg  BID  3. Urinary urgency Flomax  0.4mg  BID   No follow-ups on file.  Vincent Clara, MD  [facility] [place]

## 2023-03-28 ENCOUNTER — Encounter: Payer: Self-pay | Admitting: Urology

## 2023-03-28 NOTE — Patient Instructions (Signed)

## 2023-04-01 ENCOUNTER — Ambulatory Visit (HOSPITAL_BASED_OUTPATIENT_CLINIC_OR_DEPARTMENT_OTHER)
Admission: RE | Admit: 2023-04-01 | Discharge: 2023-04-01 | Disposition: A | Discharge status: Home / Self Care | Payer: Medicare PPO | Source: Ambulatory Visit | Attending: Oncology | Admitting: Oncology

## 2023-04-01 DIAGNOSIS — J432 Centrilobular emphysema: Secondary | ICD-10-CM | POA: Diagnosis not present

## 2023-04-01 DIAGNOSIS — D709 Neutropenia, unspecified: Secondary | ICD-10-CM

## 2023-04-01 DIAGNOSIS — R918 Other nonspecific abnormal finding of lung field: Secondary | ICD-10-CM | POA: Insufficient documentation

## 2023-04-01 DIAGNOSIS — Z902 Acquired absence of lung [part of]: Secondary | ICD-10-CM | POA: Diagnosis not present

## 2023-04-01 DIAGNOSIS — K7689 Other specified diseases of liver: Secondary | ICD-10-CM | POA: Insufficient documentation

## 2023-04-01 DIAGNOSIS — J439 Emphysema, unspecified: Secondary | ICD-10-CM | POA: Diagnosis not present

## 2023-04-01 DIAGNOSIS — Z85818 Personal history of malignant neoplasm of other sites of lip, oral cavity, and pharynx: Secondary | ICD-10-CM | POA: Insufficient documentation

## 2023-04-04 DIAGNOSIS — I129 Hypertensive chronic kidney disease with stage 1 through stage 4 chronic kidney disease, or unspecified chronic kidney disease: Secondary | ICD-10-CM | POA: Diagnosis not present

## 2023-04-04 DIAGNOSIS — C4490 Unspecified malignant neoplasm of skin, unspecified: Secondary | ICD-10-CM | POA: Diagnosis not present

## 2023-04-04 DIAGNOSIS — N2581 Secondary hyperparathyroidism of renal origin: Secondary | ICD-10-CM | POA: Diagnosis not present

## 2023-04-04 DIAGNOSIS — D72819 Decreased white blood cell count, unspecified: Secondary | ICD-10-CM | POA: Diagnosis not present

## 2023-04-04 DIAGNOSIS — N1831 Chronic kidney disease, stage 3a: Secondary | ICD-10-CM | POA: Diagnosis not present

## 2023-04-05 ENCOUNTER — Other Ambulatory Visit: Payer: Self-pay | Admitting: *Deleted

## 2023-04-05 ENCOUNTER — Telehealth: Payer: Self-pay | Admitting: *Deleted

## 2023-04-05 DIAGNOSIS — H2513 Age-related nuclear cataract, bilateral: Secondary | ICD-10-CM | POA: Diagnosis not present

## 2023-04-05 DIAGNOSIS — H18413 Arcus senilis, bilateral: Secondary | ICD-10-CM | POA: Diagnosis not present

## 2023-04-05 DIAGNOSIS — R911 Solitary pulmonary nodule: Secondary | ICD-10-CM

## 2023-04-05 DIAGNOSIS — H25013 Cortical age-related cataract, bilateral: Secondary | ICD-10-CM | POA: Diagnosis not present

## 2023-04-05 DIAGNOSIS — H2511 Age-related nuclear cataract, right eye: Secondary | ICD-10-CM | POA: Diagnosis not present

## 2023-04-05 DIAGNOSIS — H25043 Posterior subcapsular polar age-related cataract, bilateral: Secondary | ICD-10-CM | POA: Diagnosis not present

## 2023-04-05 NOTE — Progress Notes (Signed)
Error

## 2023-04-05 NOTE — Telephone Encounter (Signed)
Called [name] with results of CT scan. Per Dr. Truett Coleman: CT's show previous nodule in left lung more solid and could be a slow growing cancer. CT abdomen was negative. Wants to refer him to the Lung Nodule Clinic at [facility]. [name] agrees.

## 2023-04-19 ENCOUNTER — Telehealth: Payer: Self-pay | Admitting: *Deleted

## 2023-04-19 NOTE — Telephone Encounter (Signed)
 Vincent Coleman had left VM yesterday asking when we will do his next CT scan. Called back today and spoke with wife and informed her that instead of CT scan Dr. Cloretta has referred him to the Lung Nodule Clinic at [facility]. Referral sent on 04/05/23. Provided her the phone # for the Lung Nodule Clinic to call for appointment.

## 2023-04-23 ENCOUNTER — Ambulatory Visit: Payer: Medicare PPO | Admitting: Acute Care

## 2023-04-23 ENCOUNTER — Encounter: Payer: Self-pay | Admitting: Acute Care

## 2023-04-23 VITALS — BP 124/72 | HR 71 | Ht 71.0 in | Wt 203.6 lb

## 2023-04-23 DIAGNOSIS — Z8501 Personal history of malignant neoplasm of esophagus: Secondary | ICD-10-CM | POA: Diagnosis not present

## 2023-04-23 DIAGNOSIS — Z87891 Personal history of nicotine dependence: Secondary | ICD-10-CM | POA: Diagnosis not present

## 2023-04-23 DIAGNOSIS — R911 Solitary pulmonary nodule: Secondary | ICD-10-CM

## 2023-04-23 NOTE — H&P (View-Only) (Signed)
 History of Present Illness Vincent Coleman is a 78 y.o. male with chronic leukopenia/thrombocytopenia, esophageal cancer greater than 20 years ago treated with local surgery and radiation, followed by Dr. Mancel Bale. Referred 04/2023 for a left lower lobe pulmonary nodule noted on a Ct Chest 04/2021. Here today to review follow up imaging.He will be followed by Dr. Delton Coombes.  Synopsis 78 y.o. male with chronic leukopenia/thrombocytopenia, followed by Dr. Mancel Bale. Referred 04/2023 for a left lower lobe pulmonary nodule noted on a Ct Chest 04/2021.  Follow-up imaging showed the left upper lobe nodule had become more solid and could be an indolent slow-growing cancer.  Dr. Pulmonary referred the patient to pulmonary for further evaluation of this finding.  He is here today for pulmonary consult.  Patient has a history of high cholesterol, chronic thrombocytopenia, CKD, and elevated PSA.  He states he had lung surgery 25 years ago for removal of a lung granuloma. He is married, lives with his wife.  He has recently traveled to [place] in the last 3 months.  He does have children who are grown.  There is a family history of emphysema and cancer in his father.  There are no specifics of the type  Patient is a former smoker, he quit smoking in 1999.  He drinks approximately 5 beers a week.  He does not use illicit drugs.  He does not note any allergies.  He does experience shortness of breath with activity and difficulty swallowing.  He is not on any maintenance respiratory inhalers. He had an admission in February 2018 with urosepsis.   04/23/2023 Pt. Presents for pulmonary consult. He had follow up CT chest to reevaluate an abnormal CT scan done in 2023.  Recommendation at that time was for 2-year follow-up CT chest which patient had done April 05, 2023.  Follow-up scan showed that the left upper lobe nodule of concern has a new solid component centrally measuring 8 x 6 mm.  This was noted as highly  suspicious for low-grade adenocarcinoma.  There was no evidence of metastatic disease within the chest or the abdomen.  I have reviewed the results with the patient who has a former smoking history quit 1999.  He has had 1 previous lung surgery for removal of a granuloma.  We discussed the option of doing a 51-month follow-up CT Chest versus doing a biopsy now.  The patient prefers to do a biopsy now to determine what this is.  I will place the orders for bronchoscopy with biopsy. We have reviewed the risks and benefits of the procedure to include bleeding, infection, pneumothorax and, and adverse reaction to anesthesia.  Patient verbalized understanding and is in agreement with having bronchoscopy with biopsies for tissue diagnosis of left upper lobe pulmonary nodule. Nodule is 8 mm and is small for PET imaging. If biopsy is +, will do PET and MRI Brain  for staging.   Test Results: CT Chest 04/01/2023 Progression of sub-solid nodule in left upper lobe, which has a new solid component centrally measuring 8 x 6 mm. This is highly suspicious for low-grade adenocarcinoma. Thoracic surgical consultation is recommended.  CT Abdomen and Pelvis 04/01/2023 Hepatobiliary: No masses visualized on this unenhanced exam. Tiny fluid attenuation hepatic cysts again noted. Gallbladder is unremarkable. No evidence of biliary ductal dilatation.   Pancreas: No mass or inflammatory changes identified on this unenhanced exam.   Spleen: Within normal limits in size.   Adrenals/Urinary Tract: No evidence of urolithiasis or hydronephrosis. Unremarkable appearance  of bladder.   Stomach/Bowel: Unremarkable visualized bowel.   Vascular/Lymphatic: No pathologically enlarged lymph nodes identified. No abdominal aortic aneurysm.        Latest Ref Rng & Units 03/18/2023    8:08 AM 11/15/2022    8:14 AM 08/17/2022    8:29 AM  CBC  WBC 4.0 - 10.5 K/uL 2.0  1.7  1.8   Hemoglobin 13.0 - 17.0 g/dL 38.7  56.4  33.2    Hematocrit 39.0 - 52.0 % 37.3  40.5  35.2   Platelets 150 - 400 K/uL 110  93  111        Latest Ref Rng & Units 03/18/2023    8:08 AM 11/15/2022    8:14 AM 08/17/2022    8:29 AM  BMP  Glucose 70 - 99 mg/dL 951  884  166   BUN 8 - 23 mg/dL 21  23  27    Creatinine 0.61 - 1.24 mg/dL 0.63  0.16  0.10   Sodium 135 - 145 mmol/L 141  140  139   Potassium 3.5 - 5.1 mmol/L 3.9  4.1  4.4   Chloride 98 - 111 mmol/L 107  106  107   CO2 22 - 32 mmol/L 27  25  25    Calcium 8.9 - 10.3 mg/dL 9.4  9.2  9.6     BNP No results found for: "BNP"  ProBNP No results found for: "PROBNP"  PFT No results found for: "FEV1PRE", "FEV1POST", "FVCPRE", "FVCPOST", "TLC", "DLCOUNC", "PREFEV1FVCRT", "PSTFEV1FVCRT"  CT ABDOMEN WO CONTRAST Result Date: 04/05/2023 CLINICAL DATA:  Follow-up left lung nodule. Prior throat carcinoma. * Tracking Code: BO * EXAM: CT CHEST AND ABDOMEN WITHOUT CONTRAST TECHNIQUE: Multidetector CT imaging of the chest and abdomen was performed following the standard protocol without intravenous contrast. RADIATION DOSE REDUCTION: This exam was performed according to the departmental dose-optimization program which includes automated exposure control, adjustment of the mA and/or kV according to patient size and/or use of iterative reconstruction technique. COMPARISON:  04/12/2021 FINDINGS: CT CHEST FINDINGS Cardiovascular:  No acute findings. Mediastinum/Lymph Nodes: No masses or pathologically enlarged lymph nodes identified on this unenhanced exam. Lungs/Pleura: Prior left upper lobectomy again noted. Mild centrilobular emphysema again noted. 5 mm subpleural nodule in the anterior right upper lobe is stable, consistent with benign etiology. A sub-solid nodule is seen in the left upper lobe, which has a new solid nodular component centrally measuring 8 x 6 mm compared to the previously pure ground-glass nodule seen on prior study. This is consistent with low-grade adenocarcinoma. No other suspicious  pulmonary nodules identified. No pleural effusion. Musculoskeletal:  No suspicious bone lesions identified. CT ABDOMEN FINDINGS Hepatobiliary: No masses visualized on this unenhanced exam. Tiny fluid attenuation hepatic cysts again noted. Gallbladder is unremarkable. No evidence of biliary ductal dilatation. Pancreas: No mass or inflammatory changes identified on this unenhanced exam. Spleen: Within normal limits in size. Adrenals/Urinary Tract: No evidence of urolithiasis or hydronephrosis. Unremarkable appearance of bladder. Stomach/Bowel: Unremarkable visualized bowel. Vascular/Lymphatic: No pathologically enlarged lymph nodes identified. No abdominal aortic aneurysm. Other:  None. Musculoskeletal:  No suspicious bone lesions identified. IMPRESSION: Progression of sub-solid nodule in left upper lobe, which has a new solid component centrally measuring 8 x 6 mm. This is highly suspicious for low-grade adenocarcinoma. Thoracic surgical consultation is recommended. No evidence of metastatic disease within the chest or abdomen. Emphysema (ICD10-J43.9). Electronically Signed   By: Danae Orleans M.D.   On: 04/05/2023 12:28   CT CHEST WO CONTRAST Result Date:  04/05/2023 CLINICAL DATA:  Follow-up left lung nodule. Prior throat carcinoma. * Tracking Code: BO * EXAM: CT CHEST AND ABDOMEN WITHOUT CONTRAST TECHNIQUE: Multidetector CT imaging of the chest and abdomen was performed following the standard protocol without intravenous contrast. RADIATION DOSE REDUCTION: This exam was performed according to the departmental dose-optimization program which includes automated exposure control, adjustment of the mA and/or kV according to patient size and/or use of iterative reconstruction technique. COMPARISON:  04/12/2021 FINDINGS: CT CHEST FINDINGS Cardiovascular:  No acute findings. Mediastinum/Lymph Nodes: No masses or pathologically enlarged lymph nodes identified on this unenhanced exam. Lungs/Pleura: Prior left upper  lobectomy again noted. Mild centrilobular emphysema again noted. 5 mm subpleural nodule in the anterior right upper lobe is stable, consistent with benign etiology. A sub-solid nodule is seen in the left upper lobe, which has a new solid nodular component centrally measuring 8 x 6 mm compared to the previously pure ground-glass nodule seen on prior study. This is consistent with low-grade adenocarcinoma. No other suspicious pulmonary nodules identified. No pleural effusion. Musculoskeletal:  No suspicious bone lesions identified. CT ABDOMEN FINDINGS Hepatobiliary: No masses visualized on this unenhanced exam. Tiny fluid attenuation hepatic cysts again noted. Gallbladder is unremarkable. No evidence of biliary ductal dilatation. Pancreas: No mass or inflammatory changes identified on this unenhanced exam. Spleen: Within normal limits in size. Adrenals/Urinary Tract: No evidence of urolithiasis or hydronephrosis. Unremarkable appearance of bladder. Stomach/Bowel: Unremarkable visualized bowel. Vascular/Lymphatic: No pathologically enlarged lymph nodes identified. No abdominal aortic aneurysm. Other:  None. Musculoskeletal:  No suspicious bone lesions identified. IMPRESSION: Progression of sub-solid nodule in left upper lobe, which has a new solid component centrally measuring 8 x 6 mm. This is highly suspicious for low-grade adenocarcinoma. Thoracic surgical consultation is recommended. No evidence of metastatic disease within the chest or abdomen. Emphysema (ICD10-J43.9). Electronically Signed   By: Danae Orleans M.D.   On: 04/05/2023 12:28     Past medical hx Past Medical History:  Diagnosis Date   CKD (chronic kidney disease)    DOE (dyspnea on exertion)    Dysuria    Emphysema of lung (HCC)    History of throat cancer    Hyperlipidemia    Hypertension      Social History   Tobacco Use   Smoking status: Former   Smokeless tobacco: Never  Vaping Use   Vaping status: Never Used  Substance Use  Topics   Alcohol use: No   Drug use: No    Mr.Lajeunesse reports that he has quit smoking. He has never used smokeless tobacco. He reports that he does not drink alcohol and does not use drugs.  Tobacco Cessation: Former smoker quit 1999    Past surgical hx, Family hx, Social hx all reviewed.  Current Outpatient Medications on File Prior to Visit  Medication Sig   Ergocalciferol (VITAMIN D2 PO) Take 800 Units by mouth daily.   FARXIGA 10 MG TABS tablet Take 10 mg by mouth daily.   levothyroxine (SYNTHROID) 112 MCG tablet Take 112 mcg by mouth daily.   omeprazole (PRILOSEC) 20 MG capsule Take 20 mg by mouth 2 (two) times daily.   rosuvastatin (CRESTOR) 40 MG tablet Take 40 mg by mouth daily.   tamsulosin (FLOMAX) 0.4 MG CAPS capsule Take 1 capsule (0.4 mg total) by mouth 2 (two) times daily.   No current facility-administered medications on file prior to visit.     No Known Allergies  Review Of Systems:  Constitutional:   No  weight loss,  night sweats,  Fevers, chills, fatigue, or  lassitude.  HEENT:   No headaches,  Difficulty swallowing,  Tooth/dental problems, or  Sore throat,                No sneezing, itching, ear ache, nasal congestion, post nasal drip,   CV:  No chest pain,  Orthopnea, PND, swelling in lower extremities, anasarca, dizziness, palpitations, syncope.   GI  No heartburn, indigestion, abdominal pain, nausea, vomiting, diarrhea, change in bowel habits, loss of appetite, bloody stools.   Resp: No shortness of breath with exertion or at rest.  No excess mucus, no productive cough,  No non-productive cough,  No coughing up of blood.  No change in color of mucus.  No wheezing.  No chest wall deformity  Skin: no rash or lesions.  GU: no dysuria, change in color of urine, no urgency or frequency.  No flank pain, no hematuria   MS:  No joint pain or swelling.  No decreased range of motion.  No back pain.  Psych:  No change in mood or affect. No depression or  anxiety.  No memory loss.   Vital Signs BP 124/72   Pulse 71   Ht 5\' 11"  (1.803 m)   Wt 203 lb 9.6 oz (92.4 kg)   SpO2 97% Comment: room air  BMI 28.40 kg/m    Physical Exam:  General- No distress,  A&Ox3, pleasant ENT: No sinus tenderness, TM clear, pale nasal mucosa, no oral exudate,no post nasal drip, no LAN Cardiac: S1, S2, regular rate and rhythm, no murmur Chest: No wheeze/ rales/ dullness; no accessory muscle use, no nasal flaring, no sternal retractions Abd.: Soft Non-tender Ext: No clubbing cyanosis, edema Neuro:  normal strength, MAE x 4, A&O x 3 Skin: No rashes, warm and dry Psych: normal mood and behavior   Assessment/Plan Enlarging left upper lobe pulmonary nodule Former smoker History of esophageal malignancy ( 20 years ago) Plan The lung nodule we have been watching has increased in size. Plan will be for a bronchoscopy with biopsies to better evaluate the nodule of concern. We have reviewed the risks to include infection, bleeding, pneumothorax, and adverse reaction to anesthesia.  You will need someone to drive you to the procedure, stay while the procedure is done, drive you home, and stay with you for 24 hours after the procedure. You will follow up in the office after the procedure to review the results of the biopsy and to ensure you have done well after the procedure. Call if you have any questions. We look forward to caring for you. Please contact office for sooner follow up if symptoms do not improve or worsen or seek emergency care    I spent 20 minutes dedicated to the care of this patient on the date of this encounter to include pre-visit review of records, face-to-face time with the patient discussing conditions above, post visit ordering of testing, clinical documentation with the electronic health record, making appropriate referrals as documented, and communicating necessary information to the patient's healthcare team.   Bronchoscopy  Letter  Ezrah Panning [address] [place] [address]    Dear Mr. Souder,  The following has been scheduled for you:   Procedure: Bronchoscopy                 Surgeon:  Dr.Byrum     Procedure date:  04/30/23 at 3:15pm    Arrival Time:  12:45pm Location: [facility] [address], [place]  [place], [place] [address]   Additional Information: Do not eat anything after midnight - ok to take medications on the day of procedure with only sips of water You will need someone to drive you home after this procedure & to be with you for the next 24 hours You may receive a call from the hospital a day or two prior to procedure to go over additional instructions & medications  NEXT VISIT:  05/28/2023 at 1:30pm with Kandice Robinsons, NP at    [facility]    [address] [place], [place]  [address]  Bevelyn Ngo, NP 04/23/2023  10:03 AM

## 2023-04-23 NOTE — Patient Instructions (Addendum)
It is good to see you today. The lung nodule we have been watching has increased in size. Plan will be for a bronchoscopy with biopsies to better evaluate the nodule of concern. We have reviewed the risks to include infection, bleeding, pneumothorax, and adverse reaction to anesthesia.  You will need someone to drive you to the procedure, stay while the procedure is done, drive you home, and stay with you for 24 hours after the procedure. You will follow up in the office after the procedure to review the results of the biopsy and to ensure you have done well after the procedure. Call if you have any questions. We look forward to caring for you. Please contact office for sooner follow up if symptoms do not improve or worsen or seek emergency care

## 2023-04-23 NOTE — Progress Notes (Signed)
 History of Present Illness Vincent Coleman is a 78 y.o. male with chronic leukopenia/thrombocytopenia, esophageal cancer greater than 20 years ago treated with local surgery and radiation, followed by Dr. Mancel Bale. Referred 04/2023 for a left lower lobe pulmonary nodule noted on a Ct Chest 04/2021. Here today to review follow up imaging.He will be followed by Dr. Delton Coombes.  Synopsis 78 y.o. male with chronic leukopenia/thrombocytopenia, followed by Dr. Mancel Bale. Referred 04/2023 for a left lower lobe pulmonary nodule noted on a Ct Chest 04/2021.  Follow-up imaging showed the left upper lobe nodule had become more solid and could be an indolent slow-growing cancer.  Dr. Pulmonary referred the patient to pulmonary for further evaluation of this finding.  He is here today for pulmonary consult.  Patient has a history of high cholesterol, chronic thrombocytopenia, CKD, and elevated PSA.  He states he had lung surgery 25 years ago for removal of a lung granuloma. He is married, lives with his wife.  He has recently traveled to [place] in the last 3 months.  He does have children who are grown.  There is a family history of emphysema and cancer in his father.  There are no specifics of the type  Patient is a former smoker, he quit smoking in 1999.  He drinks approximately 5 beers a week.  He does not use illicit drugs.  He does not note any allergies.  He does experience shortness of breath with activity and difficulty swallowing.  He is not on any maintenance respiratory inhalers. He had an admission in February 2018 with urosepsis.   04/23/2023 Pt. Presents for pulmonary consult. He had follow up CT chest to reevaluate an abnormal CT scan done in 2023.  Recommendation at that time was for 2-year follow-up CT chest which patient had done April 05, 2023.  Follow-up scan showed that the left upper lobe nodule of concern has a new solid component centrally measuring 8 x 6 mm.  This was noted as highly  suspicious for low-grade adenocarcinoma.  There was no evidence of metastatic disease within the chest or the abdomen.  I have reviewed the results with the patient who has a former smoking history quit 1999.  He has had 1 previous lung surgery for removal of a granuloma.  We discussed the option of doing a 57-month follow-up CT Chest versus doing a biopsy now.  The patient prefers to do a biopsy now to determine what this is.  I will place the orders for bronchoscopy with biopsy. We have reviewed the risks and benefits of the procedure to include bleeding, infection, pneumothorax and, and adverse reaction to anesthesia.  Patient verbalized understanding and is in agreement with having bronchoscopy with biopsies for tissue diagnosis of left upper lobe pulmonary nodule. Nodule is 8 mm and is small for PET imaging. If biopsy is +, will do PET and MRI Brain  for staging.   Test Results: CT Chest 04/01/2023 Progression of sub-solid nodule in left upper lobe, which has a new solid component centrally measuring 8 x 6 mm. This is highly suspicious for low-grade adenocarcinoma. Thoracic surgical consultation is recommended.  CT Abdomen and Pelvis 04/01/2023 Hepatobiliary: No masses visualized on this unenhanced exam. Tiny fluid attenuation hepatic cysts again noted. Gallbladder is unremarkable. No evidence of biliary ductal dilatation.   Pancreas: No mass or inflammatory changes identified on this unenhanced exam.   Spleen: Within normal limits in size.   Adrenals/Urinary Tract: No evidence of urolithiasis or hydronephrosis. Unremarkable appearance  of bladder.   Stomach/Bowel: Unremarkable visualized bowel.   Vascular/Lymphatic: No pathologically enlarged lymph nodes identified. No abdominal aortic aneurysm.        Latest Ref Rng & Units 03/18/2023    8:08 AM 11/15/2022    8:14 AM 08/17/2022    8:29 AM  CBC  WBC 4.0 - 10.5 K/uL 2.0  1.7  1.8   Hemoglobin 13.0 - 17.0 g/dL 38.7  56.4  33.2    Hematocrit 39.0 - 52.0 % 37.3  40.5  35.2   Platelets 150 - 400 K/uL 110  93  111        Latest Ref Rng & Units 03/18/2023    8:08 AM 11/15/2022    8:14 AM 08/17/2022    8:29 AM  BMP  Glucose 70 - 99 mg/dL 951  884  166   BUN 8 - 23 mg/dL 21  23  27    Creatinine 0.61 - 1.24 mg/dL 0.63  0.16  0.10   Sodium 135 - 145 mmol/L 141  140  139   Potassium 3.5 - 5.1 mmol/L 3.9  4.1  4.4   Chloride 98 - 111 mmol/L 107  106  107   CO2 22 - 32 mmol/L 27  25  25    Calcium 8.9 - 10.3 mg/dL 9.4  9.2  9.6     BNP No results found for: "BNP"  ProBNP No results found for: "PROBNP"  PFT No results found for: "FEV1PRE", "FEV1POST", "FVCPRE", "FVCPOST", "TLC", "DLCOUNC", "PREFEV1FVCRT", "PSTFEV1FVCRT"  CT ABDOMEN WO CONTRAST Result Date: 04/05/2023 CLINICAL DATA:  Follow-up left lung nodule. Prior throat carcinoma. * Tracking Code: BO * EXAM: CT CHEST AND ABDOMEN WITHOUT CONTRAST TECHNIQUE: Multidetector CT imaging of the chest and abdomen was performed following the standard protocol without intravenous contrast. RADIATION DOSE REDUCTION: This exam was performed according to the departmental dose-optimization program which includes automated exposure control, adjustment of the mA and/or kV according to patient size and/or use of iterative reconstruction technique. COMPARISON:  04/12/2021 FINDINGS: CT CHEST FINDINGS Cardiovascular:  No acute findings. Mediastinum/Lymph Nodes: No masses or pathologically enlarged lymph nodes identified on this unenhanced exam. Lungs/Pleura: Prior left upper lobectomy again noted. Mild centrilobular emphysema again noted. 5 mm subpleural nodule in the anterior right upper lobe is stable, consistent with benign etiology. A sub-solid nodule is seen in the left upper lobe, which has a new solid nodular component centrally measuring 8 x 6 mm compared to the previously pure ground-glass nodule seen on prior study. This is consistent with low-grade adenocarcinoma. No other suspicious  pulmonary nodules identified. No pleural effusion. Musculoskeletal:  No suspicious bone lesions identified. CT ABDOMEN FINDINGS Hepatobiliary: No masses visualized on this unenhanced exam. Tiny fluid attenuation hepatic cysts again noted. Gallbladder is unremarkable. No evidence of biliary ductal dilatation. Pancreas: No mass or inflammatory changes identified on this unenhanced exam. Spleen: Within normal limits in size. Adrenals/Urinary Tract: No evidence of urolithiasis or hydronephrosis. Unremarkable appearance of bladder. Stomach/Bowel: Unremarkable visualized bowel. Vascular/Lymphatic: No pathologically enlarged lymph nodes identified. No abdominal aortic aneurysm. Other:  None. Musculoskeletal:  No suspicious bone lesions identified. IMPRESSION: Progression of sub-solid nodule in left upper lobe, which has a new solid component centrally measuring 8 x 6 mm. This is highly suspicious for low-grade adenocarcinoma. Thoracic surgical consultation is recommended. No evidence of metastatic disease within the chest or abdomen. Emphysema (ICD10-J43.9). Electronically Signed   By: Danae Orleans M.D.   On: 04/05/2023 12:28   CT CHEST WO CONTRAST Result Date:  04/05/2023 CLINICAL DATA:  Follow-up left lung nodule. Prior throat carcinoma. * Tracking Code: BO * EXAM: CT CHEST AND ABDOMEN WITHOUT CONTRAST TECHNIQUE: Multidetector CT imaging of the chest and abdomen was performed following the standard protocol without intravenous contrast. RADIATION DOSE REDUCTION: This exam was performed according to the departmental dose-optimization program which includes automated exposure control, adjustment of the mA and/or kV according to patient size and/or use of iterative reconstruction technique. COMPARISON:  04/12/2021 FINDINGS: CT CHEST FINDINGS Cardiovascular:  No acute findings. Mediastinum/Lymph Nodes: No masses or pathologically enlarged lymph nodes identified on this unenhanced exam. Lungs/Pleura: Prior left upper  lobectomy again noted. Mild centrilobular emphysema again noted. 5 mm subpleural nodule in the anterior right upper lobe is stable, consistent with benign etiology. A sub-solid nodule is seen in the left upper lobe, which has a new solid nodular component centrally measuring 8 x 6 mm compared to the previously pure ground-glass nodule seen on prior study. This is consistent with low-grade adenocarcinoma. No other suspicious pulmonary nodules identified. No pleural effusion. Musculoskeletal:  No suspicious bone lesions identified. CT ABDOMEN FINDINGS Hepatobiliary: No masses visualized on this unenhanced exam. Tiny fluid attenuation hepatic cysts again noted. Gallbladder is unremarkable. No evidence of biliary ductal dilatation. Pancreas: No mass or inflammatory changes identified on this unenhanced exam. Spleen: Within normal limits in size. Adrenals/Urinary Tract: No evidence of urolithiasis or hydronephrosis. Unremarkable appearance of bladder. Stomach/Bowel: Unremarkable visualized bowel. Vascular/Lymphatic: No pathologically enlarged lymph nodes identified. No abdominal aortic aneurysm. Other:  None. Musculoskeletal:  No suspicious bone lesions identified. IMPRESSION: Progression of sub-solid nodule in left upper lobe, which has a new solid component centrally measuring 8 x 6 mm. This is highly suspicious for low-grade adenocarcinoma. Thoracic surgical consultation is recommended. No evidence of metastatic disease within the chest or abdomen. Emphysema (ICD10-J43.9). Electronically Signed   By: Danae Orleans M.D.   On: 04/05/2023 12:28     Past medical hx Past Medical History:  Diagnosis Date   CKD (chronic kidney disease)    DOE (dyspnea on exertion)    Dysuria    Emphysema of lung (HCC)    History of throat cancer    Hyperlipidemia    Hypertension      Social History   Tobacco Use   Smoking status: Former   Smokeless tobacco: Never  Vaping Use   Vaping status: Never Used  Substance Use  Topics   Alcohol use: No   Drug use: No    Vincent Coleman reports that he has quit smoking. He has never used smokeless tobacco. He reports that he does not drink alcohol and does not use drugs.  Tobacco Cessation: Former smoker quit 1999    Past surgical hx, Family hx, Social hx all reviewed.  Current Outpatient Medications on File Prior to Visit  Medication Sig   Ergocalciferol (VITAMIN D2 PO) Take 800 Units by mouth daily.   FARXIGA 10 MG TABS tablet Take 10 mg by mouth daily.   levothyroxine (SYNTHROID) 112 MCG tablet Take 112 mcg by mouth daily.   omeprazole (PRILOSEC) 20 MG capsule Take 20 mg by mouth 2 (two) times daily.   rosuvastatin (CRESTOR) 40 MG tablet Take 40 mg by mouth daily.   tamsulosin (FLOMAX) 0.4 MG CAPS capsule Take 1 capsule (0.4 mg total) by mouth 2 (two) times daily.   No current facility-administered medications on file prior to visit.     No Known Allergies  Review Of Systems:  Constitutional:   No  weight loss,  night sweats,  Fevers, chills, fatigue, or  lassitude.  HEENT:   No headaches,  Difficulty swallowing,  Tooth/dental problems, or  Sore throat,                No sneezing, itching, ear ache, nasal congestion, post nasal drip,   CV:  No chest pain,  Orthopnea, PND, swelling in lower extremities, anasarca, dizziness, palpitations, syncope.   GI  No heartburn, indigestion, abdominal pain, nausea, vomiting, diarrhea, change in bowel habits, loss of appetite, bloody stools.   Resp: No shortness of breath with exertion or at rest.  No excess mucus, no productive cough,  No non-productive cough,  No coughing up of blood.  No change in color of mucus.  No wheezing.  No chest wall deformity  Skin: no rash or lesions.  GU: no dysuria, change in color of urine, no urgency or frequency.  No flank pain, no hematuria   MS:  No joint pain or swelling.  No decreased range of motion.  No back pain.  Psych:  No change in mood or affect. No depression or  anxiety.  No memory loss.   Vital Signs BP 124/72   Pulse 71   Ht 5\' 11"  (1.803 m)   Wt 203 lb 9.6 oz (92.4 kg)   SpO2 97% Comment: room air  BMI 28.40 kg/m    Physical Exam:  General- No distress,  A&Ox3, pleasant ENT: No sinus tenderness, TM clear, pale nasal mucosa, no oral exudate,no post nasal drip, no LAN Cardiac: S1, S2, regular rate and rhythm, no murmur Chest: No wheeze/ rales/ dullness; no accessory muscle use, no nasal flaring, no sternal retractions Abd.: Soft Non-tender Ext: No clubbing cyanosis, edema Neuro:  normal strength, MAE x 4, A&O x 3 Skin: No rashes, warm and dry Psych: normal mood and behavior   Assessment/Plan Enlarging left upper lobe pulmonary nodule Former smoker History of esophageal malignancy ( 20 years ago) Plan The lung nodule we have been watching has increased in size. Plan will be for a bronchoscopy with biopsies to better evaluate the nodule of concern. We have reviewed the risks to include infection, bleeding, pneumothorax, and adverse reaction to anesthesia.  You will need someone to drive you to the procedure, stay while the procedure is done, drive you home, and stay with you for 24 hours after the procedure. You will follow up in the office after the procedure to review the results of the biopsy and to ensure you have done well after the procedure. Call if you have any questions. We look forward to caring for you. Please contact office for sooner follow up if symptoms do not improve or worsen or seek emergency care    I spent 20 minutes dedicated to the care of this patient on the date of this encounter to include pre-visit review of records, face-to-face time with the patient discussing conditions above, post visit ordering of testing, clinical documentation with the electronic health record, making appropriate referrals as documented, and communicating necessary information to the patient's healthcare team.   Bronchoscopy  Letter  Vincent Coleman [address] [place] [address]    Dear Vincent Coleman,  The following has been scheduled for you:   Procedure: Bronchoscopy                 Surgeon:  Dr.Byrum     Procedure date:  04/30/23 at 3:15pm    Arrival Time:  12:45pm Location: [facility] [address], [place], [place] [address]   Additional Information: Do not eat anything after midnight - ok to take medications on the day of procedure with only sips of water You will need someone to drive you home after this procedure & to be with you for the next 24 hours You may receive a call from the hospital a day or two prior to procedure to go over additional instructions & medications  NEXT VISIT:  05/28/2023 at 1:30pm with Kandice Robinsons, NP at    [facility]    [address] [place], [place]  [address]  Bevelyn Ngo, NP 04/23/2023  10:03 AM

## 2023-04-29 ENCOUNTER — Encounter (HOSPITAL_COMMUNITY): Payer: Self-pay | Admitting: Emergency Medicine

## 2023-04-29 ENCOUNTER — Other Ambulatory Visit: Payer: Self-pay

## 2023-04-29 NOTE — Pre-Procedure Instructions (Signed)
-------------    SDW INSTRUCTIONS given:  Your procedure is scheduled on 2/18.  Report to [facility] Main Entrance "A" at 12:45 P.M., and check in at the Admitting office.  Any questions or running late day of surgery: call [phone]    Remember:  Do not eat or drink after midnight the night before your surgery   Take these medicines the morning of surgery with A SIP OF WATER  levothyroxine, omeprazole, rosuvastatin, tamsulosin     As of today, STOP taking any Aspirin (unless otherwise instructed by your surgeon) Aleve, Naproxen, Ibuprofen, Motrin, Advil, Goody's, BC's, all herbal medications, fish oil, and all vitamins.   Do NOT Smoke (Tobacco/Vaping) 24 hours prior to your procedure  If you use a CPAP at night, you may bring all equipment for your overnight stay.     You will be asked to remove any contacts, glasses, piercing's, hearing aid's, dentures/partials prior to surgery. Please bring cases for these items if needed.     Patients discharged the day of surgery will not be allowed to drive home, and someone needs to stay with them for 24 hours.  SURGICAL WAITING ROOM VISITATION Patients may have no more than 2 support people in the waiting area - these visitors may rotate.   Pre-op nurse will coordinate an appropriate time for 1 ADULT support person, who may not rotate, to accompany patient in pre-op.  Children under the age of 48 must have an adult with them who is not the patient and must remain in the main waiting area with an adult.  If the patient needs to stay at the hospital during part of their recovery, the visitor guidelines for inpatient rooms apply.  Please refer to the [facility] website for the visitor guidelines for any additional information.   Special instructions:   [facility]- Preparing For Surgery   Please follow these instructions carefully.   Shower the NIGHT BEFORE SURGERY and the MORNING OF SURGERY with DIAL Soap.   Pat yourself dry with  a CLEAN TOWEL.  Wear CLEAN PAJAMAS to bed the night before surgery  Place CLEAN SHEETS on your bed the night of your first shower and DO NOT SLEEP WITH PETS.   Additional instructions for the day of surgery: DO NOT APPLY any lotions, deodorants, cologne, or perfumes.   Do not wear jewelry or makeup Do not wear nail polish, gel polish, artificial nails, or any other type of covering on natural nails (fingers and toes) Do not bring valuables to the hospital. [facility] is not responsible for valuables/personal belongings. Put on clean/comfortable clothes.  Please brush your teeth.  Ask your nurse before applying any prescription medications to the skin.

## 2023-04-29 NOTE — Progress Notes (Signed)
 PCP - Jorge Ny, FNP Cardiologist - pt saw Dr. Allyson Sabal in 2020- no visit noted since  PPM/ICD - denies   Chest x-ray - 04/22/16 EKG - DOS Stress Test - 10/22/17 ECHO - 10/16/17 Cardiac Cath - denies  CPAP - denies  DM- denies [place]- pt was not told to hold 72 hours. Last dose 2/17   ASA/Blood Thinner Instructions: n/a   ERAS Protcol - no, NPO  COVID TEST- n/a  Anesthesia review: yes, cardiology visit 2020 with Dr. Allyson Sabal  Patient verbally denies any shortness of breath, fever, cough and chest pain during phone call      Questions were answered. Patient verbalized understanding of instructions.

## 2023-04-30 ENCOUNTER — Ambulatory Visit (HOSPITAL_COMMUNITY): Payer: Medicare PPO

## 2023-04-30 ENCOUNTER — Encounter (HOSPITAL_COMMUNITY): Payer: Self-pay | Admitting: Emergency Medicine

## 2023-04-30 ENCOUNTER — Ambulatory Visit (HOSPITAL_COMMUNITY)
Admission: RE | Admit: 2023-04-30 | Discharge: 2023-04-30 | Disposition: A | Discharge status: Home / Self Care | Payer: Medicare PPO | Attending: Emergency Medicine | Admitting: Emergency Medicine

## 2023-04-30 ENCOUNTER — Encounter (HOSPITAL_COMMUNITY)
Admission: RE | Disposition: A | Discharge status: Home / Self Care | Payer: Self-pay | Source: Home / Self Care | Attending: Emergency Medicine

## 2023-04-30 ENCOUNTER — Ambulatory Visit (HOSPITAL_COMMUNITY): Payer: Self-pay | Admitting: Physician Assistant

## 2023-04-30 ENCOUNTER — Ambulatory Visit (HOSPITAL_BASED_OUTPATIENT_CLINIC_OR_DEPARTMENT_OTHER): Payer: Self-pay | Admitting: Physician Assistant

## 2023-04-30 ENCOUNTER — Other Ambulatory Visit: Payer: Self-pay

## 2023-04-30 DIAGNOSIS — N189 Chronic kidney disease, unspecified: Secondary | ICD-10-CM | POA: Insufficient documentation

## 2023-04-30 DIAGNOSIS — I10 Essential (primary) hypertension: Secondary | ICD-10-CM

## 2023-04-30 DIAGNOSIS — J432 Centrilobular emphysema: Secondary | ICD-10-CM | POA: Insufficient documentation

## 2023-04-30 DIAGNOSIS — Z48813 Encounter for surgical aftercare following surgery on the respiratory system: Secondary | ICD-10-CM | POA: Diagnosis not present

## 2023-04-30 DIAGNOSIS — Z87891 Personal history of nicotine dependence: Secondary | ICD-10-CM

## 2023-04-30 DIAGNOSIS — I129 Hypertensive chronic kidney disease with stage 1 through stage 4 chronic kidney disease, or unspecified chronic kidney disease: Secondary | ICD-10-CM | POA: Insufficient documentation

## 2023-04-30 DIAGNOSIS — Z902 Acquired absence of lung [part of]: Secondary | ICD-10-CM | POA: Insufficient documentation

## 2023-04-30 DIAGNOSIS — J9811 Atelectasis: Secondary | ICD-10-CM | POA: Diagnosis not present

## 2023-04-30 DIAGNOSIS — R911 Solitary pulmonary nodule: Secondary | ICD-10-CM | POA: Diagnosis not present

## 2023-04-30 DIAGNOSIS — C3432 Malignant neoplasm of lower lobe, left bronchus or lung: Secondary | ICD-10-CM | POA: Diagnosis not present

## 2023-04-30 DIAGNOSIS — Z8501 Personal history of malignant neoplasm of esophagus: Secondary | ICD-10-CM | POA: Diagnosis not present

## 2023-04-30 DIAGNOSIS — J449 Chronic obstructive pulmonary disease, unspecified: Secondary | ICD-10-CM | POA: Diagnosis not present

## 2023-04-30 HISTORY — PX: FIDUCIAL MARKER PLACEMENT: SHX6858

## 2023-04-30 HISTORY — PX: BRONCHIAL BIOPSY: SHX5109

## 2023-04-30 HISTORY — PX: BRONCHIAL NEEDLE ASPIRATION BIOPSY: SHX5106

## 2023-04-30 HISTORY — PX: BRONCHIAL BRUSHINGS: SHX5108

## 2023-04-30 HISTORY — DX: Decreased white blood cell count, unspecified: D72.819

## 2023-04-30 HISTORY — DX: Immune thrombocytopenic purpura: D69.3

## 2023-04-30 LAB — BASIC METABOLIC PANEL
Anion gap: 11 (ref 5–15)
BUN: 18 mg/dL (ref 8–23)
CO2: 21 mmol/L — ABNORMAL LOW (ref 22–32)
Calcium: 9.3 mg/dL (ref 8.9–10.3)
Chloride: 110 mmol/L (ref 98–111)
Creatinine, Ser: 1.58 mg/dL — ABNORMAL HIGH (ref 0.61–1.24)
GFR, Estimated: 44 mL/min — ABNORMAL LOW (ref >60)
Glucose, Bld: 98 mg/dL (ref 70–99)
Potassium: 4.3 mmol/L (ref 3.5–5.1)
Sodium: 142 mmol/L (ref 135–145)

## 2023-04-30 LAB — CBC
HCT: 42.1 % (ref 39.0–52.0)
Hemoglobin: 14.7 g/dL (ref 13.0–17.0)
MCH: 34.3 pg — ABNORMAL HIGH (ref 26.0–34.0)
MCHC: 34.9 g/dL (ref 30.0–36.0)
MCV: 98.1 fL (ref 80.0–100.0)
Platelets: 125 10*3/uL — ABNORMAL LOW (ref 150–400)
RBC: 4.29 MIL/uL (ref 4.22–5.81)
RDW: 14.8 % (ref 11.5–15.5)
WBC: 2.2 10*3/uL — ABNORMAL LOW (ref 4.0–10.5)
nRBC: 0 % (ref 0.0–0.2)

## 2023-04-30 SURGERY — BRONCHOSCOPY, WITH BIOPSY USING ELECTROMAGNETIC NAVIGATION
Anesthesia: General

## 2023-04-30 MED ORDER — MEPERIDINE HCL 25 MG/ML IJ SOLN: 6.2500 mg | INTRAMUSCULAR | Status: DC | PRN

## 2023-04-30 MED ORDER — CHLORHEXIDINE GLUCONATE 0.12 % MT SOLN: 15.0000 mL | Freq: Once | OROMUCOSAL | Status: AC

## 2023-04-30 MED ORDER — FENTANYL CITRATE (PF) 100 MCG/2ML IJ SOLN: 25.0000 ug | INTRAMUSCULAR | Status: DC | PRN

## 2023-04-30 MED ORDER — ONDANSETRON HCL 4 MG/2ML IJ SOLN
INTRAMUSCULAR | Status: DC | PRN
  Administered 2023-04-30: 4 mg via INTRAVENOUS

## 2023-04-30 MED ORDER — ACETAMINOPHEN 160 MG/5ML PO SOLN: 325.0000 mg | ORAL | Status: DC | PRN

## 2023-04-30 MED ORDER — CHLORHEXIDINE GLUCONATE 0.12 % MT SOLN
OROMUCOSAL | Status: AC
  Administered 2023-04-30: 15 mL via OROMUCOSAL
  Filled 2023-04-30: qty 15

## 2023-04-30 MED ORDER — PROPOFOL 10 MG/ML IV BOLUS
INTRAVENOUS | Status: DC | PRN
  Administered 2023-04-30: 150 mg via INTRAVENOUS
  Administered 2023-04-30: 20 mg via INTRAVENOUS

## 2023-04-30 MED ORDER — OXYCODONE HCL 5 MG/5ML PO SOLN: 5.0000 mg | Freq: Once | ORAL | Status: DC | PRN

## 2023-04-30 MED ORDER — ACETAMINOPHEN 325 MG PO TABS: 325.0000 mg | ORAL_TABLET | ORAL | Status: DC | PRN

## 2023-04-30 MED ORDER — SUGAMMADEX SODIUM 200 MG/2ML IV SOLN
INTRAVENOUS | Status: DC | PRN
  Administered 2023-04-30: 200 mg via INTRAVENOUS

## 2023-04-30 MED ORDER — ONDANSETRON HCL 4 MG/2ML IJ SOLN: 4.0000 mg | Freq: Once | INTRAMUSCULAR | Status: DC | PRN

## 2023-04-30 MED ORDER — OXYCODONE HCL 5 MG PO TABS: 5.0000 mg | ORAL_TABLET | Freq: Once | ORAL | Status: DC | PRN

## 2023-04-30 MED ORDER — DEXAMETHASONE SODIUM PHOSPHATE 10 MG/ML IJ SOLN
INTRAMUSCULAR | Status: DC | PRN
  Administered 2023-04-30: 10 mg via INTRAVENOUS

## 2023-04-30 MED ORDER — SODIUM CHLORIDE 0.9 % IV SOLN: INTRAVENOUS | Status: DC

## 2023-04-30 MED ORDER — PHENYLEPHRINE 80 MCG/ML (10ML) SYRINGE FOR IV PUSH (FOR BLOOD PRESSURE SUPPORT)
PREFILLED_SYRINGE | INTRAVENOUS | Status: DC | PRN
  Administered 2023-04-30: 160 ug via INTRAVENOUS
  Administered 2023-04-30: 240 ug via INTRAVENOUS
  Administered 2023-04-30: 80 ug via INTRAVENOUS
  Administered 2023-04-30: 160 ug via INTRAVENOUS

## 2023-04-30 MED ORDER — LIDOCAINE 2% (20 MG/ML) 5 ML SYRINGE
INTRAMUSCULAR | Status: DC | PRN
  Administered 2023-04-30: 100 mg via INTRAVENOUS

## 2023-04-30 MED ORDER — ROCURONIUM BROMIDE 10 MG/ML (PF) SYRINGE
PREFILLED_SYRINGE | INTRAVENOUS | Status: DC | PRN
  Administered 2023-04-30: 10 mg via INTRAVENOUS
  Administered 2023-04-30: 50 mg via INTRAVENOUS

## 2023-04-30 MED ORDER — PROPOFOL 500 MG/50ML IV EMUL
INTRAVENOUS | Status: DC | PRN
  Administered 2023-04-30: 55 ug/kg/min via INTRAVENOUS

## 2023-04-30 SURGICAL SUPPLY — 1 items: superlock fiducial marker IMPLANT

## 2023-04-30 NOTE — Interval H&P Note (Signed)
 History and Physical Interval Note:  04/30/2023 1:19 PM  Vincent Coleman  has presented today for surgery, with the diagnosis of LEFT LUNG NODULE.  The various methods of treatment have been discussed with the patient and family. After consideration of risks, benefits and other options for treatment, the patient has consented to  Procedure(s): ROBOTIC ASSISTED NAVIGATIONAL BRONCHOSCOPY WITH BIOPSIES (N/A) as a surgical intervention.  The patient's history has been reviewed, patient examined, no change in status, stable for surgery.  I have reviewed the patient's chart and labs.  Questions were answered to the patient's satisfaction.     [name]

## 2023-04-30 NOTE — Anesthesia Procedure Notes (Signed)
 Procedure Name: Intubation Date/Time: 04/30/2023 3:38 PM  Performed by: Thomasene Ripple, CRNAPre-anesthesia Checklist: Patient identified, Emergency Drugs available, Suction available and Patient being monitored Patient Re-evaluated:Patient Re-evaluated prior to induction Oxygen Delivery Method: Circle System Utilized Preoxygenation: Pre-oxygenation with 100% oxygen Induction Type: IV induction Ventilation: Mask ventilation without difficulty Laryngoscope Size: Miller and 3 Grade View: Grade I Tube type: Oral Number of attempts: 1 Airway Equipment and Method: Stylet and Oral airway Placement Confirmation: ETT inserted through vocal cords under direct vision, positive ETCO2 and breath sounds checked- equal and bilateral Secured at: 23 cm Tube secured with: Tape Dental Injury: Teeth and Oropharynx as per pre-operative assessment

## 2023-04-30 NOTE — Progress Notes (Signed)
 Dr. Tacy Dura made aware that the patient took Marcelline Deist this morning. Ok per Dr. Tacy Dura.

## 2023-04-30 NOTE — Op Note (Signed)
 Video Bronchoscopy with Robotic Assisted Bronchoscopic Navigation   Date of Operation: 04/30/2023   Pre-op Diagnosis: Left lower lobe superior segment pulmonary nodule  Post-op Diagnosis: Same  Surgeon: Levy Pupa  Assistants: None  Anesthesia: General endotracheal anesthesia  Operation: Flexible video fiberoptic bronchoscopy with robotic assistance and biopsies.  Estimated Blood Loss: Minimal  Complications: None  Indications and History: Vincent Coleman is a 78 y.o. male with history of esophageal cancer (remote), granulomatous lung disease, leukopenia and thrombocytopenia, BPH.  He has a mixed density pulmonary nodule in the superior segment of the left lower lobe that has changed in consistency on serial imaging.  Recommendation made to achieve a tissue diagnosis via robotic assisted navigational bronchoscopy. The risks, benefits, complications, treatment options and expected outcomes were discussed with the patient.  The possibilities of pneumothorax, pneumonia, reaction to medication, pulmonary aspiration, perforation of a viscus, bleeding, failure to diagnose a condition and creating a complication requiring transfusion or operation were discussed with the patient who freely signed the consent.    Description of Procedure: The patient was seen in the Preoperative Area, was examined and was deemed appropriate to proceed.  The patient was taken to [facility] endoscopy room 3, identified as Vincent Coleman and the procedure verified as Flexible Video Fiberoptic Bronchoscopy.  A Time Out was held and the above information confirmed.   Prior to the date of the procedure a high-resolution CT scan of the chest was performed. Utilizing ION software program a virtual tracheobronchial tree was generated to allow the creation of distinct navigation pathways to the patient's parenchymal abnormalities. After being taken to the operating room general anesthesia was initiated and the patient  was orally  intubated. The video fiberoptic bronchoscope was introduced via the endotracheal tube and a general inspection was performed which showed normal right lung anatomy.  In the left upper lobe bronchus had a clean stump with an intact staple line.  The left lower lobe airways were shifted superiorly and were widely patent. Aspiration of the bilateral mainstems was completed to remove any remaining secretions. Robotic catheter inserted into patient's endotracheal tube.   Target #1 left lower lobe superior segment mixed density pulmonary nodule: The distinct navigation pathways prepared prior to this procedure were then utilized to navigate to patient's lesion identified on CT scan. The robotic catheter was secured into place and the vision probe was withdrawn.  Lesion location was approximated using fluoroscopy.  Local registration and targeting was performed using Cios three-dimensional imaging. Under fluoroscopic guidance transbronchial needle brushings, transbronchial needle biopsies, and transbronchial forceps biopsies were performed to be sent for cytology and pathology.  Needle in lesion was confirmed using Cios.  Under fluoroscopic guidance a single fiducial marker was placed adjacent to the nodule.    At the end of the procedure a general airway inspection was performed and there was no evidence of active bleeding. The bronchoscope was removed.  The patient tolerated the procedure well. There was no significant blood loss and there were no obvious complications. A post-procedural chest x-ray is pending.  Samples Target #1: 1. Transbronchial needle brushings from left lower lobe superior segment nodule 2. Transbronchial Wang needle biopsies from left lower lobe superior segment nodule 3. Transbronchial forceps biopsies from left lower lobe superior segment nodule   Plans:  The patient will be discharged from the PACU to home when recovered from anesthesia and after chest x-ray is reviewed. We will  review the cytology, pathology and microbiology results with the patient when they become available.  Outpatient followup will be with Saralyn Pilar, NP or Dr. Delton Coombes.   Levy Pupa, MD, PhD 04/30/2023, 4:31 PM [name] [phone] or if no answer before 7:00PM call [phone] For any issues after 7:00PM please call eLink [phone]

## 2023-04-30 NOTE — Anesthesia Preprocedure Evaluation (Addendum)
 Anesthesia Evaluation  Patient identified by MRN, date of birth, ID band Patient awake    Reviewed: Allergy & Precautions, H&P , NPO status , Patient's Chart, lab work & pertinent test results  Airway Mallampati: II  TM Distance: >3 FB Neck ROM: Full    Dental no notable dental hx.    Pulmonary neg pulmonary ROS, COPD, former smoker   Pulmonary exam normal breath sounds clear to auscultation       Cardiovascular Exercise Tolerance: Good hypertension, Pt. on medications negative cardio ROS Normal cardiovascular exam Rhythm:Regular Rate:Normal     Neuro/Psych negative neurological ROS  negative psych ROS   GI/Hepatic negative GI ROS, Neg liver ROS,,,  Endo/Other  negative endocrine ROS    Renal/GU Renal diseasenegative Renal ROS  negative genitourinary   Musculoskeletal negative musculoskeletal ROS (+)    Abdominal   Peds negative pediatric ROS (+)  Hematology negative hematology ROS (+) Blood dyscrasia, anemia   Anesthesia Other Findings   Reproductive/Obstetrics negative OB ROS                             Anesthesia Physical Anesthesia Plan  ASA: 3  Anesthesia Plan: General   Post-op Pain Management: Minimal or no pain anticipated   Induction: Intravenous  PONV Risk Score and Plan: 2 and Ondansetron, Dexamethasone and Treatment may vary due to age or medical condition  Airway Management Planned: Oral ETT  Additional Equipment: None  Intra-op Plan:   Post-operative Plan: Extubation in OR  Informed Consent: I have reviewed the patients History and Physical, chart, labs and discussed the procedure including the risks, benefits and alternatives for the proposed anesthesia with the patient or authorized representative who has indicated his/her understanding and acceptance.       Plan Discussed with: Anesthesiologist and CRNA  Anesthesia Plan Comments: (  )         Anesthesia Quick Evaluation

## 2023-04-30 NOTE — Transfer of Care (Signed)
 Immediate Anesthesia Transfer of Care Note  Patient: Vincent Coleman  Procedure(s) Performed: ROBOTIC ASSISTED NAVIGATIONAL BRONCHOSCOPY WITH BIOPSIES BRONCHIAL BIOPSIES BRONCHIAL BRUSHINGS BRONCHIAL NEEDLE ASPIRATION BIOPSIES FIDUCIAL MARKER PLACEMENT  Patient Location: PACU  Anesthesia Type:General  Level of Consciousness: awake, alert , and oriented  Airway & Oxygen Therapy: Patient Spontanous Breathing, Patient connected to nasal cannula oxygen, and Patient connected to face mask oxygen  Post-op Assessment: Report given to RN and Post -op Vital signs reviewed and stable  Post vital signs: Reviewed and stable  Last Vitals:  Vitals Value Taken Time  BP 108/56 04/30/23 1641  Temp 36.5 C 04/30/23 1638  Pulse 70 04/30/23 1643  Resp 16 04/30/23 1643  SpO2 92 % 04/30/23 1643  Vitals shown include unfiled device data.  Last Pain:  Vitals:   04/30/23 1638  PainSc: 0-No pain      Patients Stated Pain Goal: 0 (04/30/23 1324)  Complications: No notable events documented.

## 2023-04-30 NOTE — Discharge Instructions (Addendum)
 Flexible Bronchoscopy, Care After This sheet gives you information about how to care for yourself after your test. Your doctor may also give you more specific instructions. If you have problems or questions, contact your doctor. Follow these instructions at home: Eating and drinking When your numbness is gone and your cough and gag reflexes have come back, you may: Eat only soft foods. Slowly drink liquids. When you get home after the test, go back to your normal diet. Driving Do not drive for 24 hours if you were given a medicine to help you relax (sedative). Do not drive or use heavy machinery while taking prescription pain medicine. General instructions  Take over-the-counter and prescription medicines only as told by your doctor. Return to your normal activities as told. Ask what activities are safe for you. Do not use any products that have nicotine or tobacco in them. This includes cigarettes and e-cigarettes. If you need help quitting, ask your doctor. Keep all follow-up visits as told by your doctor. This is important. It is very important if you had a tissue sample (biopsy) taken. Get help right away if: You have shortness of breath that gets worse. You get light-headed. You feel like you are going to pass out (faint). You have chest pain. You cough up: More than a little blood. More blood than before. Summary Do not eat or drink anything (not even water) for 2 hours after your test, or until your numbing medicine wears off. Do not use cigarettes. Do not use e-cigarettes. Get help right away if you have chest pain.  Please call our office for any questions or concerns.  [phone].  This information is not intended to replace advice given to you by your health care provider. Make sure you discuss any questions you have with your health care provider. Document Released: 12/24/2008 Document Revised: 02/08/2017 Document Reviewed: 03/16/2016 Elsevier Patient Education  2020  [employer].

## 2023-05-01 NOTE — Anesthesia Postprocedure Evaluation (Signed)
 Anesthesia Post Note  Patient: Vincent Coleman  Procedure(s) Performed: ROBOTIC ASSISTED NAVIGATIONAL BRONCHOSCOPY WITH BIOPSIES BRONCHIAL BIOPSIES BRONCHIAL BRUSHINGS BRONCHIAL NEEDLE ASPIRATION BIOPSIES FIDUCIAL MARKER PLACEMENT     Patient location during evaluation: PACU Anesthesia Type: General Level of consciousness: awake and alert Pain management: pain level controlled Vital Signs Assessment: post-procedure vital signs reviewed and stable Respiratory status: spontaneous breathing, nonlabored ventilation, respiratory function stable and patient connected to nasal cannula oxygen Cardiovascular status: blood pressure returned to baseline and stable Postop Assessment: no apparent nausea or vomiting Anesthetic complications: no   No notable events documented.  Last Vitals:  Vitals:   04/30/23 1647 04/30/23 1700  BP: 98/73 (!) 105/56  Pulse: 69 65  Resp: 18 11  Temp:  36.5 C  SpO2: 93% 94%    Last Pain:  Vitals:   04/30/23 1700  PainSc: 0-No pain                 Nirel Babler

## 2023-05-02 DIAGNOSIS — C44319 Basal cell carcinoma of skin of other parts of face: Secondary | ICD-10-CM | POA: Diagnosis not present

## 2023-05-02 DIAGNOSIS — L570 Actinic keratosis: Secondary | ICD-10-CM | POA: Diagnosis not present

## 2023-05-02 DIAGNOSIS — L821 Other seborrheic keratosis: Secondary | ICD-10-CM | POA: Diagnosis not present

## 2023-05-02 DIAGNOSIS — C44311 Basal cell carcinoma of skin of nose: Secondary | ICD-10-CM | POA: Diagnosis not present

## 2023-05-02 DIAGNOSIS — D225 Melanocytic nevi of trunk: Secondary | ICD-10-CM | POA: Diagnosis not present

## 2023-05-02 DIAGNOSIS — Z85828 Personal history of other malignant neoplasm of skin: Secondary | ICD-10-CM | POA: Diagnosis not present

## 2023-05-02 DIAGNOSIS — L814 Other melanin hyperpigmentation: Secondary | ICD-10-CM | POA: Diagnosis not present

## 2023-05-02 LAB — CYTOLOGY - NON PAP

## 2023-05-03 ENCOUNTER — Telehealth: Payer: Self-pay | Admitting: *Deleted

## 2023-05-03 ENCOUNTER — Encounter (HOSPITAL_COMMUNITY): Payer: Self-pay | Admitting: Emergency Medicine

## 2023-05-03 NOTE — Telephone Encounter (Signed)
 Mrs. Colmenares called to report pathology on lung was positive for cancer. Asking when he can be seen to plan treatment? Informed her that path report has been printed and left on MD desk for Monday. She will be called Monday.

## 2023-05-06 ENCOUNTER — Encounter: Payer: Self-pay | Admitting: Oncology

## 2023-05-06 ENCOUNTER — Encounter: Payer: Self-pay | Admitting: *Deleted

## 2023-05-06 ENCOUNTER — Telehealth: Payer: Self-pay | Admitting: Acute Care

## 2023-05-06 DIAGNOSIS — E039 Hypothyroidism, unspecified: Secondary | ICD-10-CM | POA: Diagnosis not present

## 2023-05-06 NOTE — Progress Notes (Signed)
 PDL-1 and Foundation one testing requested on [id number]

## 2023-05-06 NOTE — Telephone Encounter (Signed)
 This patient sent a message through MyChart:   Would like an appointment to discuss cancer diagnosis and treatment

## 2023-05-07 NOTE — Telephone Encounter (Signed)
 Vincent Coleman,   Patient has been scheduled to see Dr. Truett Perna with oncology tomorrow 05/08/2023. Do we need to schedule him to be seen  in our office as well? It looks like he was trying to figure out which provider to follow up with for treatment plans.   Revonda Standard, RN

## 2023-05-08 ENCOUNTER — Inpatient Hospital Stay: Payer: Medicare PPO | Attending: Oncology | Admitting: Oncology

## 2023-05-08 ENCOUNTER — Other Ambulatory Visit: Payer: Self-pay | Admitting: *Deleted

## 2023-05-08 VITALS — BP 136/89 | HR 98 | Temp 98.1°F | Resp 18 | Ht 71.0 in | Wt 202.1 lb

## 2023-05-08 DIAGNOSIS — Z87891 Personal history of nicotine dependence: Secondary | ICD-10-CM | POA: Insufficient documentation

## 2023-05-08 DIAGNOSIS — D696 Thrombocytopenia, unspecified: Secondary | ICD-10-CM | POA: Insufficient documentation

## 2023-05-08 DIAGNOSIS — C3412 Malignant neoplasm of upper lobe, left bronchus or lung: Secondary | ICD-10-CM | POA: Insufficient documentation

## 2023-05-08 DIAGNOSIS — C3432 Malignant neoplasm of lower lobe, left bronchus or lung: Secondary | ICD-10-CM | POA: Diagnosis not present

## 2023-05-08 DIAGNOSIS — R911 Solitary pulmonary nodule: Secondary | ICD-10-CM

## 2023-05-08 DIAGNOSIS — C3490 Malignant neoplasm of unspecified part of unspecified bronchus or lung: Secondary | ICD-10-CM | POA: Insufficient documentation

## 2023-05-08 DIAGNOSIS — D72819 Decreased white blood cell count, unspecified: Secondary | ICD-10-CM | POA: Diagnosis not present

## 2023-05-08 DIAGNOSIS — D7589 Other specified diseases of blood and blood-forming organs: Secondary | ICD-10-CM | POA: Insufficient documentation

## 2023-05-08 NOTE — Progress Notes (Signed)
 [facility] OFFICE PROGRESS NOTE   Diagnosis: Leukopenia/thrombocytopenia, non-small cell lung cancer  INTERVAL HISTORY:   Vincent Coleman returns as scheduled.  He is here with his wife.  He feels well.  No complaint.  He underwent CTs of the chest, abdomen, and pelvis 04/01/2023.  There is progression of a subsolid nodule left upper lobe compared to 2023.  No evidence of metastatic disease.  The spleen appeared normal. He was referred to Dr. Delton Coombes and underwent a bronchoscopy and biopsy of the left lung nodule on 04/30/2023.  A transbronchial biopsy and brushing were obtained.  A fiducial marker was placed adjacent to the nodule.   Objective:  Vital signs in last 24 hours:  Blood pressure 136/89, pulse 98, temperature 98.1 F (36.7 C), resp. rate 18, height 5\' 11"  (1.803 m), weight 202 lb 1.6 oz (91.7 kg), SpO2 98%.   Lymphatics: No cervical, supraclavicular, axillary, or inguinal nodes Resp: Lungs clear bilaterally Cardio: Regular rate and rhythm GI: No hepatosplenomegaly Vascular: No leg edema  Skin: Ecchymoses over the extremities, skin tear at the left upper arm   Lab Results:  Lab Results  Component Value Date   WBC 2.2 (L) 04/30/2023   HGB 14.7 04/30/2023   HCT 42.1 04/30/2023   MCV 98.1 04/30/2023   PLT 125 (L) 04/30/2023   NEUTROABS 1.1 (L) 03/18/2023    CMP  Lab Results  Component Value Date   NA 142 04/30/2023   K 4.3 04/30/2023   CL 110 04/30/2023   CO2 21 (L) 04/30/2023   GLUCOSE 98 04/30/2023   BUN 18 04/30/2023   CREATININE 1.58 (H) 04/30/2023   CALCIUM 9.3 04/30/2023   PROT 7.0 03/18/2023   ALBUMIN 4.3 03/18/2023   AST 12 (L) 03/18/2023   ALT 13 03/18/2023   ALKPHOS 49 03/18/2023   BILITOT 0.5 03/18/2023   GFRNONAA 44 (L) 04/30/2023   GFRAA >60 04/25/2016    No results found for: "CEA1", "CEA", "CAN199", "CA125"  Lab Results  Component Value Date   INR 1.05 04/22/2016   LABPROT 13.8 04/22/2016    Imaging:  No results  found.  Medications: I have reviewed the patient's current medications.   Assessment/Plan: Leukopenia, thrombocytopenia, and Red cell macrocytosis CT Abdo/pelvis 04/01/2023: Normal spleen  Chronic renal insufficiency  3.   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   Remote history of heavy tobacco use  5.   Current and chronic history of alcohol use  6.   Status post left lung surgery for removal of a "a lung granuloma ".  7.   BPH  8.   Admission in February 2018 with urosepsis  9.  Non-small cell lung cancer CTs 04/01/2023: A subsolid nodule in the left lung measures 8 x 6 cm with a new solid component, no pathologic enlarged lymph nodes.  No evidence of metastatic disease Biopsy of left lower lobe nodule 04/30/2023: Adenocarcinoma involving left lung nodule FNA biopsy and brushing, CK7 and TTF-1 positive      Disposition: Vincent Coleman has chronic leukopenia/thrombocytopenia and Red cell macrocytosis.  The hematologic findings may be related to alcohol use or unrecognized liver disease.  The CT Abdo/pelvis last month did not reveal evidence of cirrhosis.  Vincent Coleman has been diagnosed with adenocarcinoma involving a left lung nodule.  There is no clinical or radiologic evidence of metastatic disease.  He will be referred for a staging PET scan we  discussed potential treatment options if he is confirmed to have localized disease.  We discussed stereotactic radiation and surgery.  He may not be a surgical candidate secondary to previous lung surgery, COPD, and other comorbid conditions.  He will return for an office visit after the staging PET scan.  Vincent Papas, MD  05/08/2023  12:37 PM

## 2023-05-09 ENCOUNTER — Other Ambulatory Visit: Payer: Self-pay

## 2023-05-09 ENCOUNTER — Telehealth: Payer: Self-pay | Admitting: *Deleted

## 2023-05-09 NOTE — Progress Notes (Signed)
 The proposed treatment discussed in conference is for discussion purpose only and is not a binding recommendation.  The patients have not been physically examined, or presented with their treatment options.  Therefore, final treatment plans cannot be decided.

## 2023-05-09 NOTE — Telephone Encounter (Signed)
 Called Mrs. Vincent Coleman with PET scan appointment, arrival time and prep.

## 2023-05-15 NOTE — Telephone Encounter (Signed)
 Patient has a PET scheduled for 3/6 and follow up with Dr. Truett Perna on 3/13.

## 2023-05-16 ENCOUNTER — Ambulatory Visit (HOSPITAL_COMMUNITY)
Admission: RE | Admit: 2023-05-16 | Discharge: 2023-05-16 | Disposition: A | Discharge status: Home / Self Care | Payer: Medicare PPO | Source: Ambulatory Visit | Attending: Oncology | Admitting: Oncology

## 2023-05-16 DIAGNOSIS — C3412 Malignant neoplasm of upper lobe, left bronchus or lung: Secondary | ICD-10-CM | POA: Diagnosis not present

## 2023-05-16 DIAGNOSIS — R911 Solitary pulmonary nodule: Secondary | ICD-10-CM | POA: Diagnosis not present

## 2023-05-16 LAB — GLUCOSE, CAPILLARY: Glucose-Capillary: 93 mg/dL (ref 70–99)

## 2023-05-16 MED ORDER — FLUDEOXYGLUCOSE F - 18 (FDG) INJECTION
10.1000 | Freq: Once | INTRAVENOUS | Status: AC | PRN
  Administered 2023-05-16: 10.8 via INTRAVENOUS

## 2023-05-23 ENCOUNTER — Telehealth: Payer: Self-pay | Admitting: Acute Care

## 2023-05-23 ENCOUNTER — Encounter: Payer: Self-pay | Admitting: *Deleted

## 2023-05-23 ENCOUNTER — Inpatient Hospital Stay: Payer: Medicare PPO | Attending: Oncology | Admitting: Oncology

## 2023-05-23 ENCOUNTER — Telehealth: Payer: Self-pay | Admitting: Radiation Oncology

## 2023-05-23 VITALS — BP 123/64 | HR 73 | Temp 98.1°F | Resp 20 | Ht 71.0 in | Wt 204.9 lb

## 2023-05-23 DIAGNOSIS — C3432 Malignant neoplasm of lower lobe, left bronchus or lung: Secondary | ICD-10-CM

## 2023-05-23 DIAGNOSIS — D709 Neutropenia, unspecified: Secondary | ICD-10-CM

## 2023-05-23 DIAGNOSIS — C3412 Malignant neoplasm of upper lobe, left bronchus or lung: Secondary | ICD-10-CM | POA: Diagnosis not present

## 2023-05-23 DIAGNOSIS — D696 Thrombocytopenia, unspecified: Secondary | ICD-10-CM | POA: Diagnosis not present

## 2023-05-23 DIAGNOSIS — Z8501 Personal history of malignant neoplasm of esophagus: Secondary | ICD-10-CM | POA: Insufficient documentation

## 2023-05-23 NOTE — Telephone Encounter (Addendum)
 3/13 @ 10:34 am Left voicemail for patient to call our office to be schedule for consult.

## 2023-05-23 NOTE — Progress Notes (Signed)
 PATIENT NAVIGATOR PROGRESS NOTE  Name: Vincent Coleman Date: 05/23/2023 MRN: [id number]  DOB: 06/25/1945   Reason for visit:  F/U visit  Comments:    During follow up visit with Dr Truett Perna  [facility] molecular testing ordered on [id number] Referral placed for consult radiation oncology for consideration of SBRT to left lower lung  Time spent counseling/coordinating care: 45-60 minutes

## 2023-05-23 NOTE — Telephone Encounter (Signed)
 States he does on need his Bronch FU now. Will have to wait until after radiation. I thought MS. G would want to know.

## 2023-05-23 NOTE — Progress Notes (Signed)
 [facility] OFFICE PROGRESS NOTE   Diagnosis: Non-small cell lung cancer, leukopenia/thrombocytopenia  INTERVAL HISTORY:   Vincent Coleman returns as scheduled.  He feels well.  No complaint.  Objective:  Vital signs in last 24 hours:  Blood pressure 123/64, pulse 73, temperature 98.1 F (36.7 C), temperature source Temporal, resp. rate 20, height 5\' 11"  (1.803 m), weight 204 lb 14.4 oz (92.9 kg), SpO2 96%.    Resp: Lungs clear bilaterally Cardio: Rate and rhythm GI: No hepatosplenomegaly Vascular: No leg edema   Lab Results:  Lab Results  Component Value Date   WBC 2.2 (L) 04/30/2023   HGB 14.7 04/30/2023   HCT 42.1 04/30/2023   MCV 98.1 04/30/2023   PLT 125 (L) 04/30/2023   NEUTROABS 1.1 (L) 03/18/2023    CMP  Lab Results  Component Value Date   NA 142 04/30/2023   K 4.3 04/30/2023   CL 110 04/30/2023   CO2 21 (L) 04/30/2023   GLUCOSE 98 04/30/2023   BUN 18 04/30/2023   CREATININE 1.58 (H) 04/30/2023   CALCIUM 9.3 04/30/2023   PROT 7.0 03/18/2023   ALBUMIN 4.3 03/18/2023   AST 12 (L) 03/18/2023   ALT 13 03/18/2023   ALKPHOS 49 03/18/2023   BILITOT 0.5 03/18/2023   GFRNONAA 44 (L) 04/30/2023   GFRAA >60 04/25/2016    No results found for: "CEA1", "CEA", "ZOX096", "CA125"  Lab Results  Component Value Date   INR 1.05 04/22/2016   LABPROT 13.8 04/22/2016    Imaging: PET images from 05/16/2023 reviewed with Vincent Coleman and his wife  Medications: I have reviewed the patient's current medications.   Assessment/Plan: Leukopenia, thrombocytopenia, and Red cell macrocytosis CT Abdo/pelvis 04/01/2023: Normal spleen  Chronic renal insufficiency  3.   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   Remote history of heavy tobacco use  5.   Current and chronic history of alcohol use  6.   Status post left lung surgery for removal of a "a lung granuloma ".  7.    BPH  8.   Admission in February 2018 with urosepsis  9.  Non-small cell lung cancer CTs 04/01/2023: A subsolid nodule in the left lung measures 8 x 6 cm with a new solid component, no pathologic enlarged lymph nodes.  No evidence of metastatic disease Biopsy of left lower lobe nodule 04/30/2023: Adenocarcinoma involving left lung nodule FNA biopsy and brushing, CK7 and TTF-1 positive PET 05/16/2023: Interval fiducial placement in the left upper lobe with a new nodule (non-hypermetabolic) along the anterior margin of the fiducial, indistinct left upper lobe nodule suspicious for low-grade adenocarcinoma-SUV 2.5, stable right upper lobe subpleural nodule without metabolic activity        Disposition: Vincent Coleman appears stable.  He has been diagnosed with non-small cell lung cancer involving a left lung nodule.  He appears to have early stage adenocarcinoma of the left lung.  We will submit the biopsy tissue for molecular testing.  I discussed treatment options with Vincent Coleman and his wife.  We discussed surgery and SBRT.  He has multiple comorbid conditions including CT findings of emphysema, though he does not have a formal diagnosis of COPD.  He has a remote history of heavy tobacco use.  He has undergone a lung resection in the remote past.  Vincent Coleman does not wish to consider surgery at present.  He agrees to a radiation oncology referral.  He has chronic leukopenia/thrombocytopenia, potentially related to subclinical liver disease or ongoing alcohol use.  The differential diagnosis includes myelodysplasia.  He will be followed with observation.  He will return for an office and lab visit in 2 months.  Thornton Papas, MD  05/23/2023  9:45 AM

## 2023-05-27 NOTE — Telephone Encounter (Signed)
 As of 05/27/2023 no future appts in office or CT's appts scheduled.

## 2023-05-27 NOTE — Progress Notes (Signed)
 Thoracic Location of Tumor / Histology: Left Lower Lobe Lung   Patient presented    Biopsies of LLL Lung 04/30/2023   Past/Anticipated interventions by cardiothoracic surgery, if any:   Past/Anticipated interventions by medical oncology, if any:  Dr. Truett Perna 05/23/2023 - We discussed surgery and SBRT.  He has multiple comorbid conditions including CT findings of emphysema, though he does not have a formal diagnosis of COPD.   -He has a remote history of heavy tobacco use.  He has undergone a lung resection in the remote past. -Vincent Coleman does not wish to consider surgery at present.  He agrees to a radiation oncology referral.   Tobacco/Marijuana/Snuff/ETOH use: Former smoker   Signs/Symptoms Weight changes, if any: Steady. Respiratory complaints, if any: He reports SOB with increased activities. Hemoptysis, if any: He reports dry cough in the mornings. Pain issues, if any:  Denies chest pain, pressure, or tightness.  SAFETY ISSUES: Prior radiation? Esophagus/Throat, treated 25 years ago. 45-50 fractions. [place], with Vincent Coleman. Pacemaker/ICD? No   Possible current pregnancy? N/a Is the patient on methotrexate? No  Current Complaints / other details:

## 2023-05-28 ENCOUNTER — Ambulatory Visit
Admission: RE | Admit: 2023-05-28 | Discharge: 2023-05-28 | Disposition: A | Discharge status: Home / Self Care | Source: Ambulatory Visit | Attending: Radiation Oncology | Admitting: Radiation Oncology

## 2023-05-28 ENCOUNTER — Encounter: Payer: Self-pay | Admitting: Radiation Oncology

## 2023-05-28 ENCOUNTER — Ambulatory Visit: Payer: Medicare PPO | Admitting: Acute Care

## 2023-05-28 VITALS — BP 139/65 | HR 68 | Temp 97.5°F | Resp 20 | Ht 71.0 in | Wt 203.2 lb

## 2023-05-28 DIAGNOSIS — J439 Emphysema, unspecified: Secondary | ICD-10-CM | POA: Diagnosis not present

## 2023-05-28 DIAGNOSIS — Z79899 Other long term (current) drug therapy: Secondary | ICD-10-CM | POA: Insufficient documentation

## 2023-05-28 DIAGNOSIS — C3432 Malignant neoplasm of lower lobe, left bronchus or lung: Secondary | ICD-10-CM

## 2023-05-28 DIAGNOSIS — Z85818 Personal history of malignant neoplasm of other sites of lip, oral cavity, and pharynx: Secondary | ICD-10-CM | POA: Insufficient documentation

## 2023-05-28 DIAGNOSIS — Z87891 Personal history of nicotine dependence: Secondary | ICD-10-CM | POA: Diagnosis not present

## 2023-05-28 DIAGNOSIS — C3412 Malignant neoplasm of upper lobe, left bronchus or lung: Secondary | ICD-10-CM

## 2023-05-28 DIAGNOSIS — N189 Chronic kidney disease, unspecified: Secondary | ICD-10-CM | POA: Insufficient documentation

## 2023-05-28 DIAGNOSIS — I700 Atherosclerosis of aorta: Secondary | ICD-10-CM | POA: Insufficient documentation

## 2023-05-28 DIAGNOSIS — Z923 Personal history of irradiation: Secondary | ICD-10-CM | POA: Insufficient documentation

## 2023-05-28 DIAGNOSIS — I10 Essential (primary) hypertension: Secondary | ICD-10-CM | POA: Diagnosis not present

## 2023-05-28 DIAGNOSIS — N400 Enlarged prostate without lower urinary tract symptoms: Secondary | ICD-10-CM | POA: Diagnosis not present

## 2023-05-28 DIAGNOSIS — Z79890 Hormone replacement therapy (postmenopausal): Secondary | ICD-10-CM | POA: Diagnosis not present

## 2023-05-28 DIAGNOSIS — D693 Immune thrombocytopenic purpura: Secondary | ICD-10-CM | POA: Diagnosis not present

## 2023-05-28 DIAGNOSIS — M6250 Muscle wasting and atrophy, not elsewhere classified, unspecified site: Secondary | ICD-10-CM | POA: Diagnosis not present

## 2023-05-28 DIAGNOSIS — Z7984 Long term (current) use of oral hypoglycemic drugs: Secondary | ICD-10-CM | POA: Diagnosis not present

## 2023-05-28 DIAGNOSIS — K5730 Diverticulosis of large intestine without perforation or abscess without bleeding: Secondary | ICD-10-CM | POA: Diagnosis not present

## 2023-05-28 DIAGNOSIS — E785 Hyperlipidemia, unspecified: Secondary | ICD-10-CM | POA: Insufficient documentation

## 2023-05-28 NOTE — Telephone Encounter (Signed)
Bronch cancelled.

## 2023-05-28 NOTE — Progress Notes (Cosign Needed)
 Radiation Oncology         [phone] ________________________________  Name: Vincent Coleman        MRN: [id number]  Date of Service: 05/28/2023 DOB: February 21, 1946  WU:[id number], Natale Lay, FNP  Ladene Artist, MD     REFERRING PHYSICIAN: Ladene Artist, MD   DIAGNOSIS: The primary encounter diagnosis was Primary malignant neoplasm of left lower lobe of lung (HCC). A diagnosis of Malignant neoplasm of upper lobe of left lung (HCC) was also pertinent to this visit.   HISTORY OF PRESENT ILLNESS: Vincent Coleman is a 78 y.o. male seen at the request of Dr. Truett Perna for a newly diagnosed left lung cancer. He has a history of what sounds like larynx cancer treated with resection and adjuvant radiation in [place]. [place], [place] in the early 2000s. During his surveillance a nodule was found and he underwent left upper lobectomy for granulomatous disease in about 2005.   He had an injury in 2022 that prompted a CT chest in [place] on 12/03/20 that showed a 10 mm nodule that was ground glass in appearance in the LUL. He had follow up on 04/12/21 that showed a 12 mm nodule but noted in the LLL, felt to be corresponding to the previously noted location from 2022. Another CT without contrast on 04/01/23 showed a nodule in the LUL again noted with a new solid component of 8 mm, and a stable RUL nodule that was 5 mm which was stable. He had bronchoscopy with fiducial marker placement on 04/30/23 that showed adenocarcinoma of the nodule labeled LLL in the FNA and brushing specimens. A PET scan on 05/16/23 showed the LUL nodule of concern was somewhat indistinct with bandlike density spanning from the ficual posterior, and had SUV of 2.5. An interval LUL nodule measuring 1.2 cm along the anterior margin of the fiducial was not hypermetabolic. He's seen to consider stereotactic body radiotherapy (SBRT) as he is not in favor of surgical resection. Of note Dr. Mitzi Hansen feels that this nodule is located in the LUL despite  documentation of it being in the LLL.     PREVIOUS RADIATION THERAPY: Yes   Around 2000-2004 The patient describes about 7-8 weeks of adjuvant radiation to the lower neck cancer following surgery near the larynx. Details unavailable but treatment given in [place]. [place], [place].    PAST MEDICAL HISTORY:  Past Medical History:  Diagnosis Date   Chronic ITP (idiopathic thrombocytopenia) (HCC)    Chronic leukopenia    CKD (chronic kidney disease)    DOE (dyspnea on exertion)    Dysuria    Emphysema of lung (HCC)    History of throat cancer    Hyperlipidemia    Hypertension        PAST SURGICAL HISTORY: Past Surgical History:  Procedure Laterality Date   BRONCHIAL BIOPSY  04/30/2023   Procedure: BRONCHIAL BIOPSIES;  Surgeon: [name], MD;  Location: [facility] ENDOSCOPY;  Service: Pulmonary;;   BRONCHIAL BRUSHINGS  04/30/2023   Procedure: BRONCHIAL BRUSHINGS;  Surgeon: [name], MD;  Location: [facility] ENDOSCOPY;  Service: Pulmonary;;   BRONCHIAL NEEDLE ASPIRATION BIOPSY  04/30/2023   Procedure: BRONCHIAL NEEDLE ASPIRATION BIOPSIES;  Surgeon: [name], MD;  Location: [facility] ENDOSCOPY;  Service: Pulmonary;;   FIDUCIAL MARKER PLACEMENT  04/30/2023   Procedure: FIDUCIAL MARKER PLACEMENT;  Surgeon: [name], MD;  Location: [facility] ENDOSCOPY;  Service: Pulmonary;;   LUNG SURGERY     granuloma removed   SKIN CANCER EXCISION  multiple spots removed   THROAT SURGERY  2000   throat cancer     FAMILY HISTORY:  Family History  Problem Relation Age of Onset   Cancer Father      SOCIAL HISTORY:  reports that he quit smoking about 25 years ago. His smoking use included cigarettes. He has never used smokeless tobacco. He reports current alcohol use of about 4.0 standard drinks of alcohol per week. He reports that he does not use drugs. The patient is married and lives in [place]. 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 He enjoys playing golf in retirement.     ALLERGIES: Patient has no known allergies.   MEDICATIONS:  Current Outpatient Medications  Medication Sig Dispense Refill   Ergocalciferol (VITAMIN D2 PO) Take 800 Units by mouth daily at 6 (six) AM.     FARXIGA 10 MG TABS tablet Take 10 mg by mouth daily.     levothyroxine (SYNTHROID) 112 MCG tablet Take 112 mcg by mouth daily.     omeprazole (PRILOSEC) 20 MG capsule Take 20 mg by mouth 2 (two) times daily.     rosuvastatin (CRESTOR) 40 MG tablet Take 40 mg by mouth daily.     tamsulosin (FLOMAX) 0.4 MG CAPS capsule Take 1 capsule (0.4 mg total) by mouth 2 (two) times daily. 180 capsule 3   gatifloxacin (ZYMAXID) 0.5 % SOLN Place 1 drop into the right eye 4 (four) times daily. (Patient not taking: Reported on 05/28/2023)     ketorolac (ACULAR) 0.5 % ophthalmic solution Place 1 drop into the right eye 4 (four) times daily. (Patient not taking: Reported on 05/28/2023)     prednisoLONE acetate (PRED FORTE) 1 % ophthalmic suspension Place 1 drop into the right eye 4 (four) times daily. (Patient not taking: Reported on 05/28/2023)     No current facility-administered medications for this encounter.     REVIEW OF SYSTEMS: On review of systems, the patient reports that he is doing well without complaints of shortness of breath at rest, though has some with exertion. He denies chest pain, or unintended weight loss. He denies any hemoptysis since bronchoscopy, but has a dry cough in the mornings.      PHYSICAL EXAM:  Wt Readings from Last 3 Encounters:  05/28/23 203 lb 3.2 oz (92.2 kg)  05/23/23 204 lb 14.4 oz (92.9 kg)  05/08/23 202 lb 1.6 oz (91.7 kg)   Temp Readings from Last 3 Encounters:  05/28/23 (!) 97.5 F (36.4 C)  05/23/23 98.1 F (36.7 C) (Temporal)  05/08/23 98.1 F (36.7 C)   BP Readings from Last 3 Encounters:  05/28/23 139/65  05/23/23 123/64  05/08/23 136/89   Pulse Readings from Last 3 Encounters:  05/28/23 68  05/23/23 73  05/08/23 98   Pain  Assessment Pain Score: 0-No pain/10  In general this is a well appearing caucasian male in no acute distress. He's alert and oriented x4 and appropriate throughout the examination. Cardiopulmonary assessment is negative for acute distress and he exhibits normal effort.     ECOG = 1  0 - Asymptomatic (Fully active, able to carry on all predisease activities without restriction)  1 - Symptomatic but completely ambulatory (Restricted in physically strenuous activity but ambulatory and able to carry out work of a light or sedentary nature. For example, light housework, office work)  2 - Symptomatic, <50% in bed during the day (Ambulatory and capable of all self care but unable to carry out any  work activities. Up and about more than 50% of waking hours)  3 - Symptomatic, >50% in bed, but not bedbound (Capable of only limited self-care, confined to bed or chair 50% or more of waking hours)  4 - Bedbound (Completely disabled. Cannot carry on any self-care. Totally confined to bed or chair)  5 - Death   Santiago Glad MM, Creech RH, Tormey DC, et al. [phone]). "Toxicity and response criteria of the [facility]". Am. Evlyn Clines. Oncol. 5 (6): 649-55    LABORATORY DATA:  Lab Results  Component Value Date   WBC 2.2 (L) 04/30/2023   HGB 14.7 04/30/2023   HCT 42.1 04/30/2023   MCV 98.1 04/30/2023   PLT 125 (L) 04/30/2023   Lab Results  Component Value Date   NA 142 04/30/2023   K 4.3 04/30/2023   CL 110 04/30/2023   CO2 21 (L) 04/30/2023   Lab Results  Component Value Date   ALT 13 03/18/2023   AST 12 (L) 03/18/2023   GGT 29 01/30/2021   ALKPHOS 49 03/18/2023   BILITOT 0.5 03/18/2023      RADIOGRAPHY: NM PET Image Initial (PI) Skull Base To Thigh Result Date: 05/22/2023 CLINICAL DATA:  Subsequent treatment strategy for head and neck cancer with part solid left upper lobe nodule. EXAM: NUCLEAR MEDICINE PET SKULL BASE TO THIGH TECHNIQUE: 10.0 mCi F-18 FDG was injected  intravenously. Full-ring PET imaging was performed from the skull base to thigh after the radiotracer. CT data was obtained and used for attenuation correction and anatomic localization. Fasting blood glucose: 93 mg/dl COMPARISON:  Multiple exams, including CT examination 04/01/2023 FINDINGS: Mediastinal blood pool activity: SUV max 3.1 Liver activity: SUV max NA NECK: Relative hypo activity along the left sylvian fissure on top most image 1 of the PET data set is likely due to volume averaging of the sylvian fissure. No significant abnormal hypermetabolic activity in this region. Incidental CT findings: Small polyp or mucous retention cyst laterally in the left maxillary sinus. Bilateral common carotid atheromatous vascular calcification. CHEST: Interval fiducial placement in the left upper lobe with a new 1.2 by 0.6 cm nodule along the anterior margin of the fiducial on image 19 series 7. This nodule does not demonstrate metabolic activity, and given that this is an interval finding I suspect that it may be inflammatory and related to recent bronchoscopy and fiducial placement. The left upper lobe nodule of concern is somewhat indistinct, with a band of subsolid density spanning from the fiducial posteriorly towards this nodule. The nodule did demonstrate a suspicious increase in internal stellate density within the surrounding faint ground-glass opacity comparing the 04/12/2021 exam to the 04/01/2023 exam. The nodule currently has a maximum SUV of 2.5. Incidental CT findings: Stable 5 by 3 mm subpleural right upper lobe nodule on image 27 series 7 without metabolic activity, but below sensitive PET-CT size thresholds. Coronary, aortic arch, and branch vessel atherosclerotic vascular disease. Upper normal heart size. ABDOMEN/PELVIS: No significant abnormal hypermetabolic activity in this region. Incidental CT findings: Atherosclerosis is present, including aortoiliac atherosclerotic disease. Sigmoid colon  diverticulosis. Mild prostatomegaly. Mild asymmetric atrophy of the right vastus musculature compared to the left. SKELETON: No significant abnormal hypermetabolic activity in this region. Incidental CT findings: None. IMPRESSION: 1. Interval fiducial placement in the left upper lobe with a new 1.2 by 0.6 cm nodule along the anterior margin of the fiducial. This nodule does not demonstrate metabolic activity, and given that this is an interval finding I suspect  that this is inflammatory and related to recent bronchoscopy and fiducial placement. 2. The left upper lobe nodule of concern is somewhat indistinct, with a band of subsolid density spanning from the fiducial posteriorly towards this nodule. The nodule did demonstrate a suspicious increase in internal stellate density within the surrounding faint ground-glass opacity comparing the 04/12/2021 exam to the 04/01/2023 exam. The nodule currently has a maximum SUV of 2.5. This nodule remains suspicious for low-grade adenocarcinoma. 3. Stable 5 by 3 mm subpleural right upper lobe nodule without metabolic activity, but below sensitive PET-CT size thresholds, benign etiology favored based on stability and size. 4. Coronary, aortic arch, and branch vessel atherosclerotic vascular disease. 5. Sigmoid colon diverticulosis. 6. Mild prostatomegaly. 7. Mild asymmetric atrophy of the right vastus musculature compared to the left. 8. Aortic Atherosclerosis (ICD10-I70.0). Coronary and systemic atherosclerosis. Electronically Signed   By: Gaylyn Rong M.D.   On: 05/22/2023 14:37   DG Chest Port 1 View Result Date: 04/30/2023 CLINICAL DATA:  Status post bronchoscopy and biopsy. EXAM: PORTABLE CHEST 1 VIEW COMPARISON:  Chest radiograph dated 04/22/2017 and CT dated 04/01/2023. FINDINGS: Bibasilar atelectasis. No focal consolidation, pleural effusion, pneumothorax. The cardiac silhouette is within limits. Left hilar surgical clips. No acute osseous pathology. IMPRESSION:  1. No pneumothorax. 2. Bibasilar atelectasis. Electronically Signed   By: Elgie Collard M.D.   On: 04/30/2023 18:40   DG C-ARM BRONCHOSCOPY Result Date: 04/30/2023 C-ARM BRONCHOSCOPY: Fluoroscopy was utilized by the requesting physician.  No radiographic interpretation.   DG C-Arm 1-60 Min-No Report Result Date: 04/30/2023 Fluoroscopy was utilized by the requesting physician.  No radiographic interpretation.       IMPRESSION/PLAN: 1. Stage IA1, cT1aN0M0, NSCLC, adenocarcinoma of the LUL (though described in several locations as LLL). Dr. Mitzi Hansen discusses the pathology findings and reviews the nature of early stage lung cancer. Dr. Mitzi Hansen reviews that the standard of care is for surgical resection. However for patients who are not medical candidates to undergo surgery, or who choose to forgo surgery, stereotactic body radiotherapy (SBRT) is an appropriate alternative. We discussed the risks, benefits, short, and long term effects of radiotherapy, as well as the curative intent, and the patient is interested in proceeding. Dr. Mitzi Hansen discusses the delivery and logistics of radiotherapy and anticipates a course of 3-5 fractions of radiotherapy. Written consent is obtained and placed in the chart, a copy was provided to the patient. The patient will be contacted to coordinate treatment planning by our simulation department. We will work around his upcoming cataract removals on 06/05/23, and 06/19/23. 2. History of head and neck cancer. The patient has done well without recurrence and has graduated to routine follow up with PCP. This history will be considered in the future but prior radiotherapy fields should not interfere with his treatment for #1.   In a visit lasting 60 minutes, greater than 50% of the time was spent face to face discussing the patient's condition, in preparation for the discussion, and coordinating the patient's care.   The above documentation reflects my direct findings during this  shared patient visit. Please see the separate note by Dr. Mitzi Hansen on this date for the remainder of the patient's plan of care.    Osker Mason, [facility]   **Disclaimer: This note was dictated with voice recognition software. Similar sounding words can inadvertently be transcribed and this note may contain transcription errors which may not have been corrected upon publication of note.**

## 2023-05-29 DIAGNOSIS — C3432 Malignant neoplasm of lower lobe, left bronchus or lung: Secondary | ICD-10-CM | POA: Diagnosis not present

## 2023-05-29 DIAGNOSIS — Z87891 Personal history of nicotine dependence: Secondary | ICD-10-CM | POA: Diagnosis not present

## 2023-05-29 NOTE — Addendum Note (Signed)
 Encounter addended by: Ronny Bacon, PA-C on: 05/29/2023 7:44 AM  Actions taken: Clinical Note Signed

## 2023-05-30 ENCOUNTER — Telehealth: Payer: Self-pay | Admitting: Radiation Oncology

## 2023-05-30 NOTE — Telephone Encounter (Addendum)
 3/20 Called Dr. Pricilla Loveless office [phone] spoke to [facility] to requested prior radiation treatment from 1999, no treatment done there, patient sent to [facility]. [facility] - [phone], follow up call no records, since so long ago may need to requested at another facility -per Medical Record Dept.  They will call back to confirm if any treatment records.

## 2023-06-05 DIAGNOSIS — H2511 Age-related nuclear cataract, right eye: Secondary | ICD-10-CM | POA: Diagnosis not present

## 2023-06-06 DIAGNOSIS — H2512 Age-related nuclear cataract, left eye: Secondary | ICD-10-CM | POA: Diagnosis not present

## 2023-06-07 ENCOUNTER — Telehealth: Payer: Self-pay | Admitting: Radiation Oncology

## 2023-06-07 ENCOUNTER — Ambulatory Visit
Admission: RE | Admit: 2023-06-07 | Discharge: 2023-06-07 | Disposition: A | Discharge status: Home / Self Care | Source: Ambulatory Visit | Attending: Radiation Oncology | Admitting: Radiation Oncology

## 2023-06-07 DIAGNOSIS — C3432 Malignant neoplasm of lower lobe, left bronchus or lung: Secondary | ICD-10-CM | POA: Diagnosis not present

## 2023-06-07 DIAGNOSIS — Z87891 Personal history of nicotine dependence: Secondary | ICD-10-CM | POA: Diagnosis not present

## 2023-06-07 DIAGNOSIS — C3412 Malignant neoplasm of upper lobe, left bronchus or lung: Secondary | ICD-10-CM | POA: Diagnosis not present

## 2023-06-07 NOTE — Telephone Encounter (Signed)
 3/28 Received fax from [facility], requesting patient's signature before release of prior treatment records. Secure inbasket sent to patient with copy of [employer]/Protected Health Information form to be sent back to them for release of medical record to our department.  Waiting on records.

## 2023-06-07 NOTE — Telephone Encounter (Addendum)
 3/28 Received patient's signed copy of release of medical records.  Faxed signed copy to [facility].  Waiting on Treatments Records. 3/31 Received documentation from [facility] Record Dept, treatment records no longer available due to only hold for 10 years.

## 2023-06-07 NOTE — Telephone Encounter (Signed)
 3/28 Follow up call to [facility] spoke to [place], after no response. Phone/fax number given to [facility] Records Dept [phone]/Fax #[phone]. Fax request sent, waiting on records.

## 2023-06-14 ENCOUNTER — Encounter: Payer: Self-pay | Admitting: Oncology

## 2023-06-14 ENCOUNTER — Ambulatory Visit: Admitting: Radiation Oncology

## 2023-06-15 ENCOUNTER — Ambulatory Visit
Admission: RE | Admit: 2023-06-15 | Discharge: 2023-06-15 | Disposition: A | Discharge status: Home / Self Care | Source: Ambulatory Visit | Attending: Radiation Oncology | Admitting: Radiation Oncology

## 2023-06-15 DIAGNOSIS — C3412 Malignant neoplasm of upper lobe, left bronchus or lung: Secondary | ICD-10-CM | POA: Diagnosis not present

## 2023-06-15 DIAGNOSIS — Z87891 Personal history of nicotine dependence: Secondary | ICD-10-CM | POA: Diagnosis not present

## 2023-06-15 DIAGNOSIS — C3432 Malignant neoplasm of lower lobe, left bronchus or lung: Secondary | ICD-10-CM | POA: Diagnosis not present

## 2023-06-17 DIAGNOSIS — N1831 Chronic kidney disease, stage 3a: Secondary | ICD-10-CM | POA: Diagnosis not present

## 2023-06-17 DIAGNOSIS — I10 Essential (primary) hypertension: Secondary | ICD-10-CM | POA: Diagnosis not present

## 2023-06-17 DIAGNOSIS — E039 Hypothyroidism, unspecified: Secondary | ICD-10-CM | POA: Diagnosis not present

## 2023-06-17 DIAGNOSIS — C3492 Malignant neoplasm of unspecified part of left bronchus or lung: Secondary | ICD-10-CM | POA: Diagnosis not present

## 2023-06-17 DIAGNOSIS — Z1331 Encounter for screening for depression: Secondary | ICD-10-CM | POA: Diagnosis not present

## 2023-06-17 DIAGNOSIS — I700 Atherosclerosis of aorta: Secondary | ICD-10-CM | POA: Diagnosis not present

## 2023-06-17 DIAGNOSIS — N400 Enlarged prostate without lower urinary tract symptoms: Secondary | ICD-10-CM | POA: Diagnosis not present

## 2023-06-17 DIAGNOSIS — Z0000 Encounter for general adult medical examination without abnormal findings: Secondary | ICD-10-CM | POA: Diagnosis not present

## 2023-06-17 DIAGNOSIS — E785 Hyperlipidemia, unspecified: Secondary | ICD-10-CM | POA: Diagnosis not present

## 2023-06-18 ENCOUNTER — Other Ambulatory Visit: Payer: Self-pay

## 2023-06-18 DIAGNOSIS — C3412 Malignant neoplasm of upper lobe, left bronchus or lung: Secondary | ICD-10-CM | POA: Diagnosis not present

## 2023-06-18 LAB — RAD ONC ARIA SESSION SUMMARY
Course Elapsed Days: 0
Plan Fractions Treated to Date: 1
Plan Prescribed Dose Per Fraction: 18 Gy
Plan Total Fractions Prescribed: 3
Plan Total Prescribed Dose: 54 Gy
Reference Point Dosage Given to Date: 18 Gy
Reference Point Session Dosage Given: 18 Gy
Session Number: 1

## 2023-06-19 ENCOUNTER — Ambulatory Visit: Admitting: Radiation Oncology

## 2023-06-19 DIAGNOSIS — H2512 Age-related nuclear cataract, left eye: Secondary | ICD-10-CM | POA: Diagnosis not present

## 2023-06-20 ENCOUNTER — Ambulatory Visit
Admission: RE | Admit: 2023-06-20 | Discharge: 2023-06-20 | Disposition: A | Discharge status: Home / Self Care | Source: Ambulatory Visit | Attending: Radiation Oncology | Admitting: Radiation Oncology

## 2023-06-20 ENCOUNTER — Other Ambulatory Visit: Payer: Self-pay

## 2023-06-20 DIAGNOSIS — C3412 Malignant neoplasm of upper lobe, left bronchus or lung: Secondary | ICD-10-CM | POA: Diagnosis not present

## 2023-06-20 LAB — RAD ONC ARIA SESSION SUMMARY
Course Elapsed Days: 2
Plan Fractions Treated to Date: 2
Plan Prescribed Dose Per Fraction: 18 Gy
Plan Total Fractions Prescribed: 3
Plan Total Prescribed Dose: 54 Gy
Reference Point Dosage Given to Date: 36 Gy
Reference Point Session Dosage Given: 18 Gy
Session Number: 2

## 2023-06-21 ENCOUNTER — Ambulatory Visit: Admitting: Radiation Oncology

## 2023-06-24 ENCOUNTER — Ambulatory Visit
Admission: RE | Admit: 2023-06-24 | Discharge: 2023-06-24 | Disposition: A | Discharge status: Home / Self Care | Source: Ambulatory Visit | Attending: Radiation Oncology | Admitting: Radiation Oncology

## 2023-06-24 ENCOUNTER — Other Ambulatory Visit: Payer: Self-pay

## 2023-06-24 DIAGNOSIS — Z510 Encounter for antineoplastic radiation therapy: Secondary | ICD-10-CM | POA: Diagnosis not present

## 2023-06-24 DIAGNOSIS — C3412 Malignant neoplasm of upper lobe, left bronchus or lung: Secondary | ICD-10-CM | POA: Diagnosis not present

## 2023-06-24 DIAGNOSIS — Z87891 Personal history of nicotine dependence: Secondary | ICD-10-CM | POA: Diagnosis not present

## 2023-06-24 DIAGNOSIS — C3432 Malignant neoplasm of lower lobe, left bronchus or lung: Secondary | ICD-10-CM | POA: Diagnosis not present

## 2023-06-24 LAB — RAD ONC ARIA SESSION SUMMARY
Course Elapsed Days: 6
Plan Fractions Treated to Date: 3
Plan Prescribed Dose Per Fraction: 18 Gy
Plan Total Fractions Prescribed: 3
Plan Total Prescribed Dose: 54 Gy
Reference Point Dosage Given to Date: 54 Gy
Reference Point Session Dosage Given: 18 Gy
Session Number: 3

## 2023-06-25 NOTE — Radiation Completion Notes (Signed)
  Radiation Oncology         [phone]) [phone] ________________________________  Name: Vincent Coleman MRN: [id number]  Date of Service: 06/24/2023  DOB: 08-15-1945  End of Treatment Note     Diagnosis:     Stage IA1, cT1aN0M0, NSCLC, adenocarcinoma of the LUL (though described in several locations as LLL).   Intent: Curative     ==========DELIVERED PLANS==========  First Treatment Date: 2023-06-18 Last Treatment Date: 2023-06-24   Plan Name: Lung_L_SBRT Site: Lung, Left Technique: SBRT/SRT-IMRT Mode: Photon Dose Per Fraction: 18 Gy Prescribed Dose (Delivered / Prescribed): 54 Gy / 54 Gy Prescribed Fxs (Delivered / Prescribed): 3 / 3     ==========ON TREATMENT VISIT DATES========== 2023-06-18, 2023-06-20, 2023-06-24, 2023-06-24   See weekly On Treatment Notes in Epic for details in the Media tab (listed as Progress notes on the On Treatment Visit Dates listed above). The patient tolerated radiation.    The patient will receive a call in about one month from the radiation oncology department. He will continue follow up with Dr. Sharalyn Dasen as well in surveillance.      Shelvia Dick, PAC

## 2023-06-26 ENCOUNTER — Ambulatory Visit: Admitting: Radiation Oncology

## 2023-06-27 NOTE — Telephone Encounter (Signed)
 NFN

## 2023-06-28 ENCOUNTER — Ambulatory Visit: Admitting: Radiation Oncology

## 2023-07-09 DIAGNOSIS — C44311 Basal cell carcinoma of skin of nose: Secondary | ICD-10-CM | POA: Diagnosis not present

## 2023-07-09 DIAGNOSIS — Z85828 Personal history of other malignant neoplasm of skin: Secondary | ICD-10-CM | POA: Diagnosis not present

## 2023-07-18 NOTE — Progress Notes (Signed)
  Radiation Oncology         [phone]) [phone] ________________________________  Name: Vincent Coleman MRN: [id number]  Date of Service: 07/22/2023  DOB: [birth year]  Post Treatment Telephone Note  Diagnosis:  Stage IA1, cT1aN0M0, NSCLC, adenocarcinoma of the LUL (though described in several locations as LLL). (as documented in provider EOT note)  The patient was available for call today.   Symptoms of fatigue have improved since completing therapy.  Symptoms of skin changes have improved since completing therapy.  Symptoms of esophagitis have improved since completing therapy.  The patient has scheduled follow up with his medical oncologist Dr. Scherrie Curt for ongoing care, and was encouraged to call if he  develops concerns or questions regarding radiation.  This concludes the interaction.  Avery Bodo, LPN

## 2023-07-22 ENCOUNTER — Ambulatory Visit
Admission: RE | Admit: 2023-07-22 | Discharge: 2023-07-22 | Disposition: A | Discharge status: Home / Self Care | Source: Ambulatory Visit | Attending: Internal Medicine | Admitting: Internal Medicine

## 2023-07-25 ENCOUNTER — Telehealth: Payer: Self-pay | Admitting: Oncology

## 2023-07-25 ENCOUNTER — Inpatient Hospital Stay

## 2023-07-25 ENCOUNTER — Inpatient Hospital Stay: Attending: Oncology | Admitting: Oncology

## 2023-07-25 VITALS — BP 118/73 | HR 71 | Temp 98.2°F | Resp 18 | Ht 71.0 in | Wt 205.3 lb

## 2023-07-25 DIAGNOSIS — D709 Neutropenia, unspecified: Secondary | ICD-10-CM

## 2023-07-25 DIAGNOSIS — D72819 Decreased white blood cell count, unspecified: Secondary | ICD-10-CM | POA: Insufficient documentation

## 2023-07-25 DIAGNOSIS — Z8501 Personal history of malignant neoplasm of esophagus: Secondary | ICD-10-CM | POA: Diagnosis not present

## 2023-07-25 DIAGNOSIS — D696 Thrombocytopenia, unspecified: Secondary | ICD-10-CM | POA: Insufficient documentation

## 2023-07-25 DIAGNOSIS — C3412 Malignant neoplasm of upper lobe, left bronchus or lung: Secondary | ICD-10-CM | POA: Diagnosis not present

## 2023-07-25 LAB — CBC WITH DIFFERENTIAL (CANCER CENTER ONLY)
Abs Immature Granulocytes: 0.01 10*3/uL (ref 0.00–0.07)
Basophils Absolute: 0 10*3/uL (ref 0.0–0.1)
Basophils Relative: 1 %
Eosinophils Absolute: 0 10*3/uL (ref 0.0–0.5)
Eosinophils Relative: 2 %
HCT: 38.2 % — ABNORMAL LOW (ref 39.0–52.0)
Hemoglobin: 13.7 g/dL (ref 13.0–17.0)
Immature Granulocytes: 1 %
Lymphocytes Relative: 36 %
Lymphs Abs: 0.6 10*3/uL — ABNORMAL LOW (ref 0.7–4.0)
MCH: 34.9 pg — ABNORMAL HIGH (ref 26.0–34.0)
MCHC: 35.9 g/dL (ref 30.0–36.0)
MCV: 97.2 fL (ref 80.0–100.0)
Monocytes Absolute: 0.2 10*3/uL (ref 0.1–1.0)
Monocytes Relative: 9 %
Neutro Abs: 0.8 10*3/uL — ABNORMAL LOW (ref 1.7–7.7)
Neutrophils Relative %: 51 %
Platelet Count: 89 10*3/uL — ABNORMAL LOW (ref 150–400)
RBC: 3.93 MIL/uL — ABNORMAL LOW (ref 4.22–5.81)
RDW: 14.6 % (ref 11.5–15.5)
WBC Count: 1.6 10*3/uL — ABNORMAL LOW (ref 4.0–10.5)
nRBC: 0 % (ref 0.0–0.2)

## 2023-07-25 NOTE — Telephone Encounter (Signed)
 Patient has been scheduled for follow-up visit per 07/25/23 LOS.  LVM notifying pt of appt details, provided my direct number to pt if appt changes need to be made.

## 2023-07-25 NOTE — Progress Notes (Signed)
 [facility] OFFICE PROGRESS NOTE   Diagnosis: Leukopenia, thrombocytopenia, lung cancer  INTERVAL HISTORY:   Mr. Degnan returns as scheduled.  He recently underwent bilateral cataract surgery and reports improvement in his vision.  He was treated with SBRT to the left lung lesion 06/18/2023 - 06/24/2023.  He received 54 Gray in 3 fractions. He reports tolerating the radiation well. He drinks alcohol 1 day each week.   Objective:  Vital signs in last 24 hours:  Blood pressure 118/73, pulse 71, temperature 98.2 F (36.8 C), temperature source Temporal, resp. rate 18, height 5\' 11"  (1.803 m), weight 205 lb 4.8 oz (93.1 kg), SpO2 98%.    Lymphatics: No cervical, supraclavicular, axillary, or inguinal nodes Resp: Lungs clear bilaterally Cardio: Regular rate and rhythm GI: No hepatosplenomegaly, no mass Vascular: No leg edema  Skin: Ecchymoses over the arms  Portacath/PICC-without erythema  Lab Results:  Lab Results  Component Value Date   WBC 1.6 (L) 07/25/2023   HGB 13.7 07/25/2023   HCT 38.2 (L) 07/25/2023   MCV 97.2 07/25/2023   PLT 89 (L) 07/25/2023   NEUTROABS 0.8 (L) 07/25/2023    CMP  Lab Results  Component Value Date   NA 142 04/30/2023   K 4.3 04/30/2023   CL 110 04/30/2023   CO2 21 (L) 04/30/2023   GLUCOSE 98 04/30/2023   BUN 18 04/30/2023   CREATININE 1.58 (H) 04/30/2023   CALCIUM  9.3 04/30/2023   PROT 7.0 03/18/2023   ALBUMIN 4.3 03/18/2023   AST 12 (L) 03/18/2023   ALT 13 03/18/2023   ALKPHOS 49 03/18/2023   BILITOT 0.5 03/18/2023   GFRNONAA 44 (L) 04/30/2023   GFRAA >60 04/25/2016    No results found for: "CEA1", "CEA", "CAN199", "CA125"  Lab Results  Component Value Date   INR 1.05 04/22/2016   LABPROT 13.8 04/22/2016    Imaging:  No results found.  Medications: I have reviewed the patient's current medications.   Assessment/Plan:  Leukopenia, thrombocytopenia, and Red cell macrocytosis CT Abdo/pelvis 04/01/2023:  Normal spleen  Chronic renal insufficiency  3.   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   Remote history of heavy tobacco use  5.   Current and chronic history of alcohol use  6.   Status post left lung surgery for removal of a "a lung granuloma ".  7.   BPH  8.   Admission in February 2018 with urosepsis  9.  Non-small cell lung cancer CTs 04/01/2023: A subsolid nodule in the left lung measures 8 x 6 cm with a new solid component, no pathologic enlarged lymph nodes.  No evidence of metastatic disease Biopsy of left lower lobe nodule 04/30/2023: Adenocarcinoma involving left lung nodule FNA biopsy and brushing, CK7 and TTF-1 positive PET 05/16/2023: Interval fiducial placement in the left upper lobe with a new nodule (non-hypermetabolic) along the anterior margin of the fiducial, indistinct left upper lobe nodule suspicious for low-grade adenocarcinoma-SUV 2.5, stable right upper lobe subpleural nodule without metabolic activity SBRT 06/18/2023 - 06/24/2023, 54 Gray in 3 fractions       Disposition: Mr. Mandler has persistent neutropenia and thrombocytopenia.  The platelet count has been low for than 5 years and the neutrophil count has been low for the past several years.  I continue to suspect he has underlying liver disease based on his history of heavy alcohol use.  However there has been no documentation of  cirrhosis.  I reviewed the PET images from 05/16/2023 with the radiologist and there are no liver changes to suggest cirrhosis.  He suggested an ultrasound elastography of the liver.  The differential diagnosis includes myelodysplasia.  Mr. Elvidge will seek medical attention for symptoms of an infection.  He will return for an office and lab visit in 4 months.  We will refer him for a bone marrow biopsy if he develops progressive cytopenias or new symptoms. We will order the ultrasound  elastography.  Coni Deep, MD  07/25/2023  4:55 PM

## 2023-08-06 ENCOUNTER — Ambulatory Visit (HOSPITAL_BASED_OUTPATIENT_CLINIC_OR_DEPARTMENT_OTHER)
Admission: RE | Admit: 2023-08-06 | Discharge: 2023-08-06 | Disposition: A | Discharge status: Home / Self Care | Source: Ambulatory Visit | Attending: Oncology | Admitting: Oncology

## 2023-08-06 DIAGNOSIS — D709 Neutropenia, unspecified: Secondary | ICD-10-CM | POA: Insufficient documentation

## 2023-08-06 DIAGNOSIS — K7689 Other specified diseases of liver: Secondary | ICD-10-CM | POA: Diagnosis not present

## 2023-08-09 ENCOUNTER — Ambulatory Visit: Payer: Self-pay | Admitting: Oncology

## 2023-08-09 NOTE — Telephone Encounter (Signed)
 LVM that US  liver shows no cirrhosis. F/U as scheduled.

## 2023-09-16 ENCOUNTER — Inpatient Hospital Stay: Payer: Medicare PPO | Attending: Oncology

## 2023-09-16 ENCOUNTER — Inpatient Hospital Stay (HOSPITAL_BASED_OUTPATIENT_CLINIC_OR_DEPARTMENT_OTHER): Payer: Medicare PPO | Admitting: Oncology

## 2023-09-16 ENCOUNTER — Other Ambulatory Visit: Payer: Self-pay

## 2023-09-16 ENCOUNTER — Telehealth: Payer: Self-pay | Admitting: Urology

## 2023-09-16 VITALS — BP 121/58 | HR 70 | Temp 98.0°F | Resp 18 | Ht 71.0 in | Wt 204.5 lb

## 2023-09-16 DIAGNOSIS — F1020 Alcohol dependence, uncomplicated: Secondary | ICD-10-CM | POA: Insufficient documentation

## 2023-09-16 DIAGNOSIS — N400 Enlarged prostate without lower urinary tract symptoms: Secondary | ICD-10-CM | POA: Insufficient documentation

## 2023-09-16 DIAGNOSIS — Z87891 Personal history of nicotine dependence: Secondary | ICD-10-CM | POA: Diagnosis not present

## 2023-09-16 DIAGNOSIS — D696 Thrombocytopenia, unspecified: Secondary | ICD-10-CM | POA: Insufficient documentation

## 2023-09-16 DIAGNOSIS — Z85118 Personal history of other malignant neoplasm of bronchus and lung: Secondary | ICD-10-CM | POA: Diagnosis not present

## 2023-09-16 DIAGNOSIS — D72819 Decreased white blood cell count, unspecified: Secondary | ICD-10-CM | POA: Insufficient documentation

## 2023-09-16 DIAGNOSIS — C3432 Malignant neoplasm of lower lobe, left bronchus or lung: Secondary | ICD-10-CM

## 2023-09-16 DIAGNOSIS — D709 Neutropenia, unspecified: Secondary | ICD-10-CM

## 2023-09-16 DIAGNOSIS — N189 Chronic kidney disease, unspecified: Secondary | ICD-10-CM | POA: Diagnosis not present

## 2023-09-16 DIAGNOSIS — Z923 Personal history of irradiation: Secondary | ICD-10-CM | POA: Insufficient documentation

## 2023-09-16 DIAGNOSIS — R972 Elevated prostate specific antigen [PSA]: Secondary | ICD-10-CM

## 2023-09-16 LAB — CBC WITH DIFFERENTIAL (CANCER CENTER ONLY)
Abs Immature Granulocytes: 0 K/uL (ref 0.00–0.07)
Basophils Absolute: 0 K/uL (ref 0.0–0.1)
Basophils Relative: 0 %
Eosinophils Absolute: 0.1 K/uL (ref 0.0–0.5)
Eosinophils Relative: 3 %
HCT: 36.7 % — ABNORMAL LOW (ref 39.0–52.0)
Hemoglobin: 13.1 g/dL (ref 13.0–17.0)
Immature Granulocytes: 0 %
Lymphocytes Relative: 38 %
Lymphs Abs: 0.6 K/uL — ABNORMAL LOW (ref 0.7–4.0)
MCH: 35.5 pg — ABNORMAL HIGH (ref 26.0–34.0)
MCHC: 35.7 g/dL (ref 30.0–36.0)
MCV: 99.5 fL (ref 80.0–100.0)
Monocytes Absolute: 0.1 K/uL (ref 0.1–1.0)
Monocytes Relative: 7 %
Neutro Abs: 0.9 K/uL — ABNORMAL LOW (ref 1.7–7.7)
Neutrophils Relative %: 52 %
Platelet Count: 77 K/uL — ABNORMAL LOW (ref 150–400)
RBC: 3.69 MIL/uL — ABNORMAL LOW (ref 4.22–5.81)
RDW: 14.7 % (ref 11.5–15.5)
WBC Count: 1.6 K/uL — ABNORMAL LOW (ref 4.0–10.5)
nRBC: 0 % (ref 0.0–0.2)

## 2023-09-16 NOTE — Progress Notes (Signed)
 [facility] OFFICE PROGRESS NOTE   Diagnosis: Leukopenia/thrombocytopenia, lung cancer  INTERVAL HISTORY:   [name] returns as scheduled.  He feels well.  Good appetite.  No complaint.  He has bruising over the arms.  No other bleeding.  Objective:  Vital signs in last 24 hours:  Blood pressure (!) 121/58, pulse 70, temperature 98 F (36.7 C), temperature source Temporal, resp. rate 18, height 5' 11 (1.803 m), weight 204 lb 8 oz (92.8 kg), SpO2 98%.    Lymphatics: No cervical, supraclavicular, axillary, or inguinal nodes Resp: Lungs clear bilaterally Cardio: Regular rate and rhythm GI: No hepatosplenomegaly Vascular: No leg edema  Skin: Few ecchymoses over the arms with diffuse purpura at the lower arms   Lab Results:  Lab Results  Component Value Date   WBC 1.6 (L) 09/16/2023   HGB 13.1 09/16/2023   HCT 36.7 (L) 09/16/2023   MCV 99.5 09/16/2023   PLT 77 (L) 09/16/2023   NEUTROABS 0.9 (L) 09/16/2023    CMP  Lab Results  Component Value Date   NA 142 04/30/2023   K 4.3 04/30/2023   CL 110 04/30/2023   CO2 21 (L) 04/30/2023   GLUCOSE 98 04/30/2023   BUN 18 04/30/2023   CREATININE 1.58 (H) 04/30/2023   CALCIUM  9.3 04/30/2023   PROT 7.0 03/18/2023   ALBUMIN 4.3 03/18/2023   AST 12 (L) 03/18/2023   ALT 13 03/18/2023   ALKPHOS 49 03/18/2023   BILITOT 0.5 03/18/2023   GFRNONAA 44 (L) 04/30/2023   GFRAA >60 04/25/2016    No results found for: CEA1, CEA, CAN199, CA125  Lab Results  Component Value Date   INR 1.05 04/22/2016   LABPROT 13.8 04/22/2016    Imaging:  No results found.  Medications: I have reviewed the patient's current medications.   Assessment/Plan: Leukopenia, thrombocytopenia, and Red cell macrocytosis CT Abdo/pelvis 04/01/2023: Normal spleen Ultrasound elastography liver 08/06/2023:kPa 1.8, high probability of being normal  Chronic renal insufficiency  3.   Report of esophagus cancer diagnosed greater  than 20 years ago and treated with local surgery followed by radiation  4.   Remote history of heavy tobacco use  5.   Current and chronic history of alcohol use  6.   Status post left lung surgery for removal of a a lung granuloma .  7.   BPH  8.   Admission in February 2018 with urosepsis  9.  Non-small cell lung cancer CTs 04/01/2023: A subsolid nodule in the left lung measures 8 x 6 cm with a new solid component, no pathologic enlarged lymph nodes.  No evidence of metastatic disease Biopsy of left lower lobe nodule 04/30/2023: Adenocarcinoma involving left lung nodule FNA biopsy and brushing, CK7 and TTF-1 positive PET 05/16/2023: Interval fiducial placement in the left upper lobe with a new nodule (non-hypermetabolic) along the anterior margin of the fiducial, indistinct left upper lobe nodule suspicious for low-grade adenocarcinoma-SUV 2.5, stable right upper lobe subpleural nodule without metabolic activity SBRT 06/18/2023 - 06/24/2023, 54 Gray in 3 fractions      Disposition: Vincent Coleman appears stable.  He has persistent neutropenia/thrombocytopenia.  Imaging studies do not reveal evidence of cirrhosis.  He may have myelodysplasia.  We discussed the possibility of myelodysplasia and the indication for a bone marrow biopsy.  He prefers observation for now.  He will call for bleeding. Vincent Coleman will undergo a restaging chest CT next month to follow-up on the left lung adenocarcinoma.  He will return for  an office visit and CBC a few days after the CT scan.  He will be referred for diagnostic bone marrow biopsy if he develops progressive cytopenias.  Arley Hof, MD  09/16/2023  9:18 AM

## 2023-09-16 NOTE — Telephone Encounter (Signed)
 Dr. Little orders were released, if he wants to have other blood work done he will need to contact ordering provider.  If he wants these orders faxed to a specific [facility] in [place] he will need to provide us  with a fax number.

## 2023-09-16 NOTE — Telephone Encounter (Signed)
 Patient wants orders released to the [facility] in [place] , he will have them done there.

## 2023-09-18 ENCOUNTER — Other Ambulatory Visit: Payer: Medicare PPO

## 2023-09-18 DIAGNOSIS — R972 Elevated prostate specific antigen [PSA]: Secondary | ICD-10-CM | POA: Diagnosis not present

## 2023-09-20 LAB — PSA, TOTAL AND FREE
PSA, Free Pct: 26.2 %
PSA, Free: 2.57 ng/mL
Prostate Specific Ag, Serum: 9.8 ng/mL — ABNORMAL HIGH (ref 0.0–4.0)

## 2023-09-23 ENCOUNTER — Encounter: Payer: Self-pay | Admitting: Urology

## 2023-09-23 ENCOUNTER — Ambulatory Visit: Payer: Medicare PPO | Admitting: Urology

## 2023-09-23 VITALS — BP 98/66 | HR 87

## 2023-09-23 DIAGNOSIS — R972 Elevated prostate specific antigen [PSA]: Secondary | ICD-10-CM | POA: Diagnosis not present

## 2023-09-23 DIAGNOSIS — R3915 Urgency of urination: Secondary | ICD-10-CM | POA: Diagnosis not present

## 2023-09-23 DIAGNOSIS — N401 Enlarged prostate with lower urinary tract symptoms: Secondary | ICD-10-CM

## 2023-09-23 LAB — URINALYSIS, ROUTINE W REFLEX MICROSCOPIC
Bilirubin, UA: NEGATIVE
Ketones, UA: NEGATIVE
Leukocytes,UA: NEGATIVE
Nitrite, UA: NEGATIVE
RBC, UA: NEGATIVE
Specific Gravity, UA: 1.02 (ref 1.005–1.030)
Urobilinogen, Ur: 0.2 mg/dL (ref 0.2–1.0)
pH, UA: 6 (ref 5.0–7.5)

## 2023-09-23 LAB — MICROSCOPIC EXAMINATION
Bacteria, UA: NONE SEEN
RBC, Urine: NONE SEEN /HPF (ref 0–2)

## 2023-09-23 MED ORDER — SILODOSIN 8 MG PO CAPS: 8.0000 mg | ORAL_CAPSULE | Freq: Every day | ORAL | 11 refills | Status: DC

## 2023-09-23 NOTE — Patient Instructions (Signed)

## 2023-09-23 NOTE — Progress Notes (Signed)
 09/23/2023 9:12 AM   Charlie Raw 1945/07/19 [id number]  Referring provider: Regino Slater, MD [address] [place],  [place] [address]  Followup PSA   HPI: Mr Codner is a 78yo here for followup for elevated PSA and BPH. PSA increased to 9.8 from 6.4. IPSS 27 QOL 2 on flomax  0.4mg  BID. Urine stream is fair. He has intermittent straining to urinate. He is unhappy with his urination. He has a feeling of incomplete emptying. He is currently being evaluated for low WBCs.    PMH: Past Medical History:  Diagnosis Date   Chronic ITP (idiopathic thrombocytopenia) (HCC)    Chronic leukopenia    CKD (chronic kidney disease)    DOE (dyspnea on exertion)    Dysuria    Emphysema of lung (HCC)    History of throat cancer    Hyperlipidemia    Hypertension     Surgical History: Past Surgical History:  Procedure Laterality Date   BRONCHIAL BIOPSY  04/30/2023   Procedure: BRONCHIAL BIOPSIES;  Surgeon: Shelah Lamar RAMAN, MD;  Location: [facility] ENDOSCOPY;  Service: Pulmonary;;   BRONCHIAL BRUSHINGS  04/30/2023   Procedure: BRONCHIAL BRUSHINGS;  Surgeon: Shelah Lamar RAMAN, MD;  Location: [facility] ENDOSCOPY;  Service: Pulmonary;;   BRONCHIAL NEEDLE ASPIRATION BIOPSY  04/30/2023   Procedure: BRONCHIAL NEEDLE ASPIRATION BIOPSIES;  Surgeon: Shelah Lamar RAMAN, MD;  Location: [facility] ENDOSCOPY;  Service: Pulmonary;;   FIDUCIAL MARKER PLACEMENT  04/30/2023   Procedure: FIDUCIAL MARKER PLACEMENT;  Surgeon: Shelah Lamar RAMAN, MD;  Location: [facility] ENDOSCOPY;  Service: Pulmonary;;   LUNG SURGERY     granuloma removed   SKIN CANCER EXCISION     multiple spots removed   THROAT SURGERY  2000   throat cancer    Home Medications:  Allergies as of 09/23/2023   No Known Allergies      Medication List        Accurate as of September 23, 2023  9:12 AM. If you have any questions, ask your nurse or doctor.          Farxiga 10 MG Tabs tablet Generic drug: dapagliflozin propanediol Take 10 mg by mouth  daily.   levothyroxine  112 MCG tablet Commonly known as: SYNTHROID  Take 112 mcg by mouth daily.   omeprazole 20 MG capsule Commonly known as: PRILOSEC Take 20 mg by mouth 2 (two) times daily.   rosuvastatin  40 MG tablet Commonly known as: CRESTOR  Take 40 mg by mouth daily.   tamsulosin  0.4 MG Caps capsule Commonly known as: FLOMAX  Take 1 capsule (0.4 mg total) by mouth 2 (two) times daily.   VITAMIN D2 PO Take 800 Units by mouth daily at 6 (six) AM.        Allergies: No Known Allergies  Family History: Family History  Problem Relation Age of Onset   Cancer Father     Social History:  reports that he quit smoking about 25 years ago. His smoking use included cigarettes. He has never used smokeless tobacco. He reports current alcohol use of about 4.0 standard drinks of alcohol per week. He reports that he does not use drugs.  ROS: All other review of systems were reviewed and are negative except what is noted above in HPI  Physical Exam: BP 98/66   Pulse 87   Constitutional:  Alert and oriented, No acute distress. HEENT: [place] AT, moist mucus membranes.  Trachea midline, no masses. Cardiovascular: No clubbing, cyanosis, or edema. Respiratory: Normal respiratory effort, no increased work of breathing.  GI: Abdomen is soft, nontender, nondistended, no abdominal masses GU: No CVA tenderness.  Lymph: No cervical or inguinal lymphadenopathy. Skin: No rashes, bruises or suspicious lesions. Neurologic: Grossly intact, no focal deficits, moving all 4 extremities. Psychiatric: Normal mood and affect.  Laboratory Data: Lab Results  Component Value Date   WBC 1.6 (L) 09/16/2023   HGB 13.1 09/16/2023   HCT 36.7 (L) 09/16/2023   MCV 99.5 09/16/2023   PLT 77 (L) 09/16/2023    Lab Results  Component Value Date   CREATININE 1.58 (H) 04/30/2023    No results found for: PSA  No results found for: TESTOSTERONE  No results found for: HGBA1C  Urinalysis     Component Value Date/Time   COLORURINE YELLOW 04/22/2016 2339   APPEARANCEUR Clear 03/25/2023 0842   LABSPEC 1.023 04/22/2016 2339   PHURINE 5.0 04/22/2016 2339   GLUCOSEU 3+ (A) 03/25/2023 0842   HGBUR MODERATE (A) 04/22/2016 2339   BILIRUBINUR Negative 03/25/2023 0842   KETONESUR 5 (A) 04/22/2016 2339   PROTEINUR 1+ (A) 03/25/2023 0842   PROTEINUR 30 (A) 04/22/2016 2339   UROBILINOGEN 0.2 11/20/2019 0859   NITRITE Negative 03/25/2023 0842   NITRITE POSITIVE (A) 04/22/2016 2339   LEUKOCYTESUR Negative 03/25/2023 0842    Lab Results  Component Value Date   LABMICR See below: 03/25/2023   WBCUA 0-5 03/25/2023   LABEPIT 0-10 03/25/2023   MUCUS Present 12/30/2020   BACTERIA None seen 03/25/2023    Pertinent Imaging:  No results found for this or any previous visit.  No results found for this or any previous visit.  No results found for this or any previous visit.  No results found for this or any previous visit.  Results for orders placed during the hospital encounter of 05/31/16  US  Renal  Narrative CLINICAL DATA:  Stage III chronic kidney disease, history hypertension, throat cancer, former smoker  EXAM: RENAL / URINARY TRACT ULTRASOUND COMPLETE  COMPARISON:  None  FINDINGS: Right Kidney:  Length: 11.6 cm. Mild age-related cortical thinning. Normal cortical echogenicity. No mass, hydronephrosis or shadowing calcification.  Left Kidney:  Length: 11.6 cm. Normal cortical echogenicity. Mild age-related renal cortical thinning. Small cystic lesion at upper pole 14 x 8 x 10 mm. No additional renal mass, hydronephrosis or shadowing calcification.  Bladder:  Normal appearance. BILATERAL ureteral jets visualized. Prostate gland appears enlarged 4.6 cm transverse by 2.8 cm AP, length inadequately establish but likely at least 4.4 cm.  IMPRESSION: Small LEFT renal cyst.  Age-related renal cortical thinning.  Prostatic enlargement.   Electronically  Signed By: Oneil Kiss M.D. On: 05/31/2016 16:23  No results found for this or any previous visit.  No results found for this or any previous visit.  No results found for this or any previous visit.   Assessment & Plan:    1. Elevated PSA (Primary) -IsoPSA, will call with results - Urinalysis, Routine w reflex microscopic  2. Benign localized prostatic hyperplasia with lower urinary tract symptoms (LUTS) -We will trial rapaflo  8mg    3. Urinary urgency -We will trial rapaflo  8mg  daily   No follow-ups on file.  Belvie Clara, MD  [facility]

## 2023-10-01 ENCOUNTER — Encounter: Payer: Self-pay | Admitting: Urology

## 2023-10-02 ENCOUNTER — Telehealth: Payer: Self-pay

## 2023-10-02 NOTE — Telephone Encounter (Signed)
-----   Message from [facility] Kourtney B sent at 10/02/2023  8:03 AM EDT ----- ISO results

## 2023-10-02 NOTE — Telephone Encounter (Signed)
 Patient called made aware of IsoPsa results and providers response to f/u in 6 months with psa.

## 2023-10-21 ENCOUNTER — Ambulatory Visit (HOSPITAL_BASED_OUTPATIENT_CLINIC_OR_DEPARTMENT_OTHER)
Admission: RE | Admit: 2023-10-21 | Discharge: 2023-10-21 | Disposition: A | Discharge status: Home / Self Care | Source: Ambulatory Visit | Attending: Oncology | Admitting: Oncology

## 2023-10-21 DIAGNOSIS — J432 Centrilobular emphysema: Secondary | ICD-10-CM | POA: Diagnosis not present

## 2023-10-21 DIAGNOSIS — I700 Atherosclerosis of aorta: Secondary | ICD-10-CM | POA: Diagnosis not present

## 2023-10-21 DIAGNOSIS — C3490 Malignant neoplasm of unspecified part of unspecified bronchus or lung: Secondary | ICD-10-CM | POA: Diagnosis not present

## 2023-10-21 DIAGNOSIS — C3432 Malignant neoplasm of lower lobe, left bronchus or lung: Secondary | ICD-10-CM | POA: Diagnosis not present

## 2023-10-28 ENCOUNTER — Inpatient Hospital Stay: Admitting: Oncology

## 2023-10-28 ENCOUNTER — Other Ambulatory Visit (HOSPITAL_BASED_OUTPATIENT_CLINIC_OR_DEPARTMENT_OTHER): Payer: Self-pay

## 2023-10-28 ENCOUNTER — Telehealth: Payer: Self-pay | Admitting: Oncology

## 2023-10-28 ENCOUNTER — Inpatient Hospital Stay: Attending: Oncology

## 2023-10-28 VITALS — BP 98/59 | HR 88 | Temp 98.1°F | Resp 20 | Ht 71.0 in | Wt 206.5 lb

## 2023-10-28 DIAGNOSIS — D709 Neutropenia, unspecified: Secondary | ICD-10-CM

## 2023-10-28 DIAGNOSIS — M25572 Pain in left ankle and joints of left foot: Secondary | ICD-10-CM | POA: Diagnosis not present

## 2023-10-28 DIAGNOSIS — D61818 Other pancytopenia: Secondary | ICD-10-CM | POA: Diagnosis not present

## 2023-10-28 DIAGNOSIS — Z85118 Personal history of other malignant neoplasm of bronchus and lung: Secondary | ICD-10-CM | POA: Insufficient documentation

## 2023-10-28 DIAGNOSIS — N189 Chronic kidney disease, unspecified: Secondary | ICD-10-CM | POA: Insufficient documentation

## 2023-10-28 DIAGNOSIS — J8410 Pulmonary fibrosis, unspecified: Secondary | ICD-10-CM | POA: Insufficient documentation

## 2023-10-28 DIAGNOSIS — N400 Enlarged prostate without lower urinary tract symptoms: Secondary | ICD-10-CM | POA: Diagnosis not present

## 2023-10-28 DIAGNOSIS — Z79899 Other long term (current) drug therapy: Secondary | ICD-10-CM | POA: Diagnosis not present

## 2023-10-28 DIAGNOSIS — D7589 Other specified diseases of blood and blood-forming organs: Secondary | ICD-10-CM | POA: Insufficient documentation

## 2023-10-28 DIAGNOSIS — Z87891 Personal history of nicotine dependence: Secondary | ICD-10-CM | POA: Diagnosis not present

## 2023-10-28 DIAGNOSIS — Z7289 Other problems related to lifestyle: Secondary | ICD-10-CM | POA: Diagnosis not present

## 2023-10-28 DIAGNOSIS — J984 Other disorders of lung: Secondary | ICD-10-CM | POA: Insufficient documentation

## 2023-10-28 LAB — CBC WITH DIFFERENTIAL (CANCER CENTER ONLY)
Abs Immature Granulocytes: 0 K/uL (ref 0.00–0.07)
Basophils Absolute: 0 K/uL (ref 0.0–0.1)
Basophils Relative: 0 %
Eosinophils Absolute: 0 K/uL (ref 0.0–0.5)
Eosinophils Relative: 3 %
HCT: 35.7 % — ABNORMAL LOW (ref 39.0–52.0)
Hemoglobin: 12.7 g/dL — ABNORMAL LOW (ref 13.0–17.0)
Immature Granulocytes: 0 %
Lymphocytes Relative: 37 %
Lymphs Abs: 0.5 K/uL — ABNORMAL LOW (ref 0.7–4.0)
MCH: 35.2 pg — ABNORMAL HIGH (ref 26.0–34.0)
MCHC: 35.6 g/dL (ref 30.0–36.0)
MCV: 98.9 fL (ref 80.0–100.0)
Monocytes Absolute: 0.1 K/uL (ref 0.1–1.0)
Monocytes Relative: 4 %
Neutro Abs: 0.8 K/uL — ABNORMAL LOW (ref 1.7–7.7)
Neutrophils Relative %: 56 %
Platelet Count: 84 K/uL — ABNORMAL LOW (ref 150–400)
RBC: 3.61 MIL/uL — ABNORMAL LOW (ref 4.22–5.81)
RDW: 14.4 % (ref 11.5–15.5)
WBC Count: 1.4 K/uL — ABNORMAL LOW (ref 4.0–10.5)
nRBC: 0 % (ref 0.0–0.2)

## 2023-10-28 MED ORDER — CAPVAXIVE 0.5 ML IM SOSY
0.5000 mL | PREFILLED_SYRINGE | Freq: Once | INTRAMUSCULAR | 0 refills | Status: AC
  Filled 2023-10-28: qty 0.5, 1d supply, fill #0

## 2023-10-28 NOTE — Progress Notes (Signed)
 [facility] OFFICE PROGRESS NOTE   Diagnosis: Pancytopenia, non-small cell lung cancer  INTERVAL HISTORY:   Mr Meulemans returns as scheduled.  He generally feels well.  No fever or recent infection.  No bleeding.  He reports pain at the left ankle with ambulation for the past month.  Objective:  Vital signs in last 24 hours:  Blood pressure (!) 98/59, pulse 88, temperature 98.1 F (36.7 C), temperature source Temporal, resp. rate 20, height 5' 11 (1.803 m), weight 206 lb 8 oz (93.7 kg), SpO2 98%.    Lymphatics: No cervical, supraclavicular, axillary, or inguinal nodes Resp: Inspiratory/expiratory bronchial sounds bilaterally, no respiratory distress Cardio: Regular rate and rhythm GI: No hepatosplenomegaly Vascular: No leg edema Neuro: The left foot strength is intact with dorsi flexion Musculoskeletal: No pain with motion at the left ankle, no swelling   Lab Results:  Lab Results  Component Value Date   WBC 1.4 (L) 10/28/2023   HGB 12.7 (L) 10/28/2023   HCT 35.7 (L) 10/28/2023   MCV 98.9 10/28/2023   PLT PENDING 10/28/2023   NEUTROABS 0.8 (L) 10/28/2023    CMP  Lab Results  Component Value Date   NA 142 04/30/2023   K 4.3 04/30/2023   CL 110 04/30/2023   CO2 21 (L) 04/30/2023   GLUCOSE 98 04/30/2023   BUN 18 04/30/2023   CREATININE 1.58 (H) 04/30/2023   CALCIUM  9.3 04/30/2023   PROT 7.0 03/18/2023   ALBUMIN 4.3 03/18/2023   AST 12 (L) 03/18/2023   ALT 13 03/18/2023   ALKPHOS 49 03/18/2023   BILITOT 0.5 03/18/2023   GFRNONAA 44 (L) 04/30/2023   GFRAA >60 04/25/2016    Medications: I have reviewed the patient's current medications.   Assessment/Plan: Leukopenia, thrombocytopenia, and Red cell macrocytosis CT Abdo/pelvis 04/01/2023: Normal spleen Ultrasound elastography liver 08/06/2023:kPa 1.8, high probability of being normal  Chronic renal insufficiency  3.   Report of esophagus cancer diagnosed greater than 20 years ago and  treated with local surgery followed by radiation  4.   Remote history of heavy tobacco use  5.   Current and chronic history of alcohol use  6.   Status post left lung surgery for removal of a a lung granuloma .  7.   BPH  8.   Admission in February 2018 with urosepsis  9.  Non-small cell lung cancer CTs 04/01/2023: A subsolid nodule in the left lung measures 8 x 6 cm with a new solid component, no pathologic enlarged lymph nodes.  No evidence of metastatic disease Biopsy of left lower lobe nodule 04/30/2023: Adenocarcinoma involving left lung nodule FNA biopsy and brushing, CK7 and TTF-1 positive PET 05/16/2023: Interval fiducial placement in the left upper lobe with a new nodule (non-hypermetabolic) along the anterior margin of the fiducial, indistinct left upper lobe nodule suspicious for low-grade adenocarcinoma-SUV 2.5, stable right upper lobe subpleural nodule without metabolic activity SBRT 06/18/2023 - 06/24/2023, 54 Gray in 3 fractions 10/21/2023 chest CT: Increased density of the peripheral left upper lobe lesion measuring 1.3 x 0.5 cm, previous less distinct measuring 1.6 x 0.8 cm, fiducial marker in the left upper lobe-I cannot appreciate a lesion immediately adjacent to the marker      Disposition: Mr. Reust was treated with SBRT for a left lung adenocarcinoma in April.  I reviewed the restaging chest CT findings and images with him.  The treated lesion appears more solid.  I suspect this reflects post radiation change.  We will confirm with  radiation oncology that the treated lesion is the one referenced in the CT report.  We will plan for a CT chest in 4-6 months.  Mr. Ronan has persistent stable pancytopenia.  He may have myelodysplasia.  He prefers observation as opposed to a diagnostic bone marrow biopsy.  He will call for bleeding.  He will remain up-to-date on the influenza and pneumonia vaccines.  He will return for an office and lab visit in 2 months.  Arley Hof,  MD  10/28/2023  9:23 AM

## 2023-10-28 NOTE — Telephone Encounter (Signed)
 Patient has been scheduled for follow-up visit per 10/28/23 LOS.  LVM notifying pt of appt details, provided my direct number to pt if appt changes need to be made.

## 2023-10-30 ENCOUNTER — Ambulatory Visit: Payer: Self-pay | Admitting: Cardiovascular Disease

## 2023-10-30 ENCOUNTER — Ambulatory Visit (HOSPITAL_COMMUNITY)
Admission: RE | Admit: 2023-10-30 | Discharge: 2023-10-30 | Disposition: A | Discharge status: Home / Self Care | Source: Ambulatory Visit | Attending: Cardiovascular Disease | Admitting: Cardiovascular Disease

## 2023-10-30 DIAGNOSIS — I6523 Occlusion and stenosis of bilateral carotid arteries: Secondary | ICD-10-CM | POA: Insufficient documentation

## 2023-10-31 DIAGNOSIS — L814 Other melanin hyperpigmentation: Secondary | ICD-10-CM | POA: Diagnosis not present

## 2023-10-31 DIAGNOSIS — C44519 Basal cell carcinoma of skin of other part of trunk: Secondary | ICD-10-CM | POA: Diagnosis not present

## 2023-10-31 DIAGNOSIS — L821 Other seborrheic keratosis: Secondary | ICD-10-CM | POA: Diagnosis not present

## 2023-10-31 DIAGNOSIS — L570 Actinic keratosis: Secondary | ICD-10-CM | POA: Diagnosis not present

## 2023-10-31 DIAGNOSIS — D225 Melanocytic nevi of trunk: Secondary | ICD-10-CM | POA: Diagnosis not present

## 2023-10-31 DIAGNOSIS — D485 Neoplasm of uncertain behavior of skin: Secondary | ICD-10-CM | POA: Diagnosis not present

## 2023-10-31 DIAGNOSIS — Z85828 Personal history of other malignant neoplasm of skin: Secondary | ICD-10-CM | POA: Diagnosis not present

## 2023-10-31 DIAGNOSIS — C44619 Basal cell carcinoma of skin of left upper limb, including shoulder: Secondary | ICD-10-CM | POA: Diagnosis not present

## 2023-11-25 ENCOUNTER — Other Ambulatory Visit

## 2023-11-25 ENCOUNTER — Ambulatory Visit: Admitting: Oncology

## 2023-11-26 DIAGNOSIS — C4490 Unspecified malignant neoplasm of skin, unspecified: Secondary | ICD-10-CM | POA: Diagnosis not present

## 2023-11-26 DIAGNOSIS — D72819 Decreased white blood cell count, unspecified: Secondary | ICD-10-CM | POA: Diagnosis not present

## 2023-11-26 DIAGNOSIS — C329 Malignant neoplasm of larynx, unspecified: Secondary | ICD-10-CM | POA: Diagnosis not present

## 2023-11-26 DIAGNOSIS — N1831 Chronic kidney disease, stage 3a: Secondary | ICD-10-CM | POA: Diagnosis not present

## 2023-11-26 DIAGNOSIS — N2581 Secondary hyperparathyroidism of renal origin: Secondary | ICD-10-CM | POA: Diagnosis not present

## 2023-11-26 DIAGNOSIS — D61818 Other pancytopenia: Secondary | ICD-10-CM | POA: Diagnosis not present

## 2023-11-26 DIAGNOSIS — I129 Hypertensive chronic kidney disease with stage 1 through stage 4 chronic kidney disease, or unspecified chronic kidney disease: Secondary | ICD-10-CM | POA: Diagnosis not present

## 2023-12-18 DIAGNOSIS — E039 Hypothyroidism, unspecified: Secondary | ICD-10-CM | POA: Diagnosis not present

## 2023-12-18 DIAGNOSIS — J449 Chronic obstructive pulmonary disease, unspecified: Secondary | ICD-10-CM | POA: Diagnosis not present

## 2023-12-18 DIAGNOSIS — D692 Other nonthrombocytopenic purpura: Secondary | ICD-10-CM | POA: Diagnosis not present

## 2023-12-18 DIAGNOSIS — D72819 Decreased white blood cell count, unspecified: Secondary | ICD-10-CM | POA: Diagnosis not present

## 2023-12-18 DIAGNOSIS — I10 Essential (primary) hypertension: Secondary | ICD-10-CM | POA: Diagnosis not present

## 2023-12-18 DIAGNOSIS — C3492 Malignant neoplasm of unspecified part of left bronchus or lung: Secondary | ICD-10-CM | POA: Diagnosis not present

## 2023-12-18 DIAGNOSIS — I700 Atherosclerosis of aorta: Secondary | ICD-10-CM | POA: Diagnosis not present

## 2023-12-18 DIAGNOSIS — Z23 Encounter for immunization: Secondary | ICD-10-CM | POA: Diagnosis not present

## 2023-12-18 DIAGNOSIS — I959 Hypotension, unspecified: Secondary | ICD-10-CM | POA: Diagnosis not present

## 2023-12-23 ENCOUNTER — Inpatient Hospital Stay: Attending: Oncology | Admitting: Oncology

## 2023-12-23 ENCOUNTER — Inpatient Hospital Stay

## 2023-12-23 VITALS — BP 133/67 | HR 79 | Temp 98.2°F | Resp 18 | Ht 71.0 in | Wt 205.8 lb

## 2023-12-23 DIAGNOSIS — Z87891 Personal history of nicotine dependence: Secondary | ICD-10-CM | POA: Diagnosis not present

## 2023-12-23 DIAGNOSIS — D61818 Other pancytopenia: Secondary | ICD-10-CM | POA: Diagnosis not present

## 2023-12-23 DIAGNOSIS — D709 Neutropenia, unspecified: Secondary | ICD-10-CM | POA: Diagnosis not present

## 2023-12-23 DIAGNOSIS — C3432 Malignant neoplasm of lower lobe, left bronchus or lung: Secondary | ICD-10-CM | POA: Diagnosis not present

## 2023-12-23 DIAGNOSIS — Z85118 Personal history of other malignant neoplasm of bronchus and lung: Secondary | ICD-10-CM | POA: Diagnosis not present

## 2023-12-23 LAB — CBC WITH DIFFERENTIAL (CANCER CENTER ONLY)
Abs Immature Granulocytes: 0 K/uL (ref 0.00–0.07)
Basophils Absolute: 0 K/uL (ref 0.0–0.1)
Basophils Relative: 1 %
Eosinophils Absolute: 0 K/uL (ref 0.0–0.5)
Eosinophils Relative: 3 %
HCT: 33.4 % — ABNORMAL LOW (ref 39.0–52.0)
Hemoglobin: 11.8 g/dL — ABNORMAL LOW (ref 13.0–17.0)
Immature Granulocytes: 0 %
Lymphocytes Relative: 30 %
Lymphs Abs: 0.4 K/uL — ABNORMAL LOW (ref 0.7–4.0)
MCH: 35.2 pg — ABNORMAL HIGH (ref 26.0–34.0)
MCHC: 35.3 g/dL (ref 30.0–36.0)
MCV: 99.7 fL (ref 80.0–100.0)
Monocytes Absolute: 0.1 K/uL (ref 0.1–1.0)
Monocytes Relative: 7 %
Neutro Abs: 0.8 K/uL — ABNORMAL LOW (ref 1.7–7.7)
Neutrophils Relative %: 59 %
Platelet Count: 90 K/uL — ABNORMAL LOW (ref 150–400)
RBC: 3.35 MIL/uL — ABNORMAL LOW (ref 4.22–5.81)
RDW: 14.6 % (ref 11.5–15.5)
WBC Count: 1.4 K/uL — ABNORMAL LOW (ref 4.0–10.5)
nRBC: 0 % (ref 0.0–0.2)

## 2023-12-23 NOTE — Progress Notes (Signed)
 [facility] OFFICE PROGRESS NOTE   Diagnosis: Lung cancer, pancytopenia  INTERVAL HISTORY:   Mr. Mikami returns as scheduled.  He feels well.  No fever or bleeding.  Good appetite and energy level.  No new complaint.  Objective:  Vital signs in last 24 hours:  Blood pressure 133/67, pulse 79, temperature 98.2 F (36.8 C), temperature source Temporal, resp. rate 18, height 5' 11 (1.803 m), weight 205 lb 12.8 oz (93.4 kg), SpO2 100%.     Lymphatics: No cervical, supraclavicular, axillary, or inguinal nodes Resp: Lungs with scattered bilateral coarserhonchi, no respiratory distress Cardio: Regular rate and rhythm GI: No hepatosplenomegaly Vascular: No leg edema   Lab Results:  Lab Results  Component Value Date   WBC 1.4 (L) 12/23/2023   HGB 11.8 (L) 12/23/2023   HCT 33.4 (L) 12/23/2023   MCV 99.7 12/23/2023   PLT 90 (L) 12/23/2023   NEUTROABS 0.8 (L) 12/23/2023    CMP  Lab Results  Component Value Date   NA 142 04/30/2023   K 4.3 04/30/2023   CL 110 04/30/2023   CO2 21 (L) 04/30/2023   GLUCOSE 98 04/30/2023   BUN 18 04/30/2023   CREATININE 1.58 (H) 04/30/2023   CALCIUM  9.3 04/30/2023   PROT 7.0 03/18/2023   ALBUMIN 4.3 03/18/2023   AST 12 (L) 03/18/2023   ALT 13 03/18/2023   ALKPHOS 49 03/18/2023   BILITOT 0.5 03/18/2023   GFRNONAA 44 (L) 04/30/2023   GFRAA >60 04/25/2016    Medications: I have reviewed the patient's current medications.   Assessment/Plan: Leukopenia, thrombocytopenia, and Red cell macrocytosis CT Abdo/pelvis 04/01/2023: Normal spleen Ultrasound elastography liver 08/06/2023:kPa 1.8, high probability of being normal  Chronic renal insufficiency  3.   Report of esophagus cancer diagnosed greater than 20 years ago and treated with local surgery followed by radiation  4.   Remote history of heavy tobacco use  5.   Current and chronic history of alcohol use  6.   Status post left lung surgery for removal of a a  lung granuloma .  7.   BPH  8.   Admission in February 2018 with urosepsis  9.  Non-small cell lung cancer CTs 04/01/2023: A subsolid nodule in the left lung measures 8 x 6 cm with a new solid component, no pathologic enlarged lymph nodes.  No evidence of metastatic disease Biopsy of left lower lobe nodule 04/30/2023: Adenocarcinoma involving left lung nodule FNA biopsy and brushing, CK7 and TTF-1 positive PET 05/16/2023: Interval fiducial placement in the left upper lobe with a new nodule (non-hypermetabolic) along the anterior margin of the fiducial, indistinct left upper lobe nodule suspicious for low-grade adenocarcinoma-SUV 2.5, stable right upper lobe subpleural nodule without metabolic activity SBRT 06/18/2023 - 06/24/2023, 54 Gray in 3 fractions 10/21/2023 chest CT: Increased density of the peripheral left upper lobe lesion measuring 1.3 x 0.5 cm, previous less distinct measuring 1.6 x 0.8 cm, fiducial marker in the left upper lobe-I cannot appreciate a lesion immediately adjacent to the marker      Disposition: Mr. Hitchner has stable pancytopenia.  It is unclear whether the cytopenias are related to chronic alcohol use or an underlying hematologic process such as myelodysplasia.  He prefers to continue observation as opposed to proceeding with a diagnostic bone marrow biopsy.  He completed SBRT to the left lung adenocarcinoma in April.  He will be scheduled for a restaging chest CT in 3 months.  Mr. Hagedorn will return for an office visit  in 3 months.  He received influenza and COVID-19 vaccines last week.  Arley Hof, MD  12/23/2023  9:33 AM

## 2024-01-31 ENCOUNTER — Ambulatory Visit: Admitting: Urology

## 2024-03-24 ENCOUNTER — Ambulatory Visit (HOSPITAL_BASED_OUTPATIENT_CLINIC_OR_DEPARTMENT_OTHER)
Admission: RE | Admit: 2024-03-24 | Discharge: 2024-03-24 | Disposition: A | Discharge status: Home / Self Care | Source: Ambulatory Visit | Attending: Oncology | Admitting: Oncology

## 2024-03-24 DIAGNOSIS — C3432 Malignant neoplasm of lower lobe, left bronchus or lung: Secondary | ICD-10-CM | POA: Diagnosis present

## 2024-03-26 ENCOUNTER — Inpatient Hospital Stay: Attending: Oncology

## 2024-03-26 ENCOUNTER — Inpatient Hospital Stay: Admitting: Oncology

## 2024-03-26 VITALS — BP 115/70 | HR 78 | Temp 97.9°F | Resp 18 | Ht 71.0 in | Wt 199.8 lb

## 2024-03-26 DIAGNOSIS — C3432 Malignant neoplasm of lower lobe, left bronchus or lung: Secondary | ICD-10-CM

## 2024-03-26 DIAGNOSIS — D709 Neutropenia, unspecified: Secondary | ICD-10-CM

## 2024-03-26 LAB — CBC WITH DIFFERENTIAL (CANCER CENTER ONLY)
Abs Immature Granulocytes: 0 K/uL (ref 0.00–0.07)
Basophils Absolute: 0 K/uL (ref 0.0–0.1)
Basophils Relative: 1 %
Eosinophils Absolute: 0 K/uL (ref 0.0–0.5)
Eosinophils Relative: 2 %
HCT: 35.1 % — ABNORMAL LOW (ref 39.0–52.0)
Hemoglobin: 12.4 g/dL — ABNORMAL LOW (ref 13.0–17.0)
Immature Granulocytes: 0 %
Lymphocytes Relative: 34 %
Lymphs Abs: 0.5 K/uL — ABNORMAL LOW (ref 0.7–4.0)
MCH: 34.9 pg — ABNORMAL HIGH (ref 26.0–34.0)
MCHC: 35.3 g/dL (ref 30.0–36.0)
MCV: 98.9 fL (ref 80.0–100.0)
Monocytes Absolute: 0.1 K/uL (ref 0.1–1.0)
Monocytes Relative: 6 %
Neutro Abs: 0.8 K/uL — ABNORMAL LOW (ref 1.7–7.7)
Neutrophils Relative %: 57 %
Platelet Count: 101 K/uL — ABNORMAL LOW (ref 150–400)
RBC: 3.55 MIL/uL — ABNORMAL LOW (ref 4.22–5.81)
RDW: 15.4 % (ref 11.5–15.5)
WBC Count: 1.4 K/uL — ABNORMAL LOW (ref 4.0–10.5)
nRBC: 0 % (ref 0.0–0.2)

## 2024-03-26 NOTE — Progress Notes (Signed)
 " [facility] OFFICE PROGRESS NOTE   Diagnosis: Lung nodule, pancytopenia  INTERVAL HISTORY:   [name] returns as scheduled.  He generally feels well.  He has a cough in the mornings.  No consistent cough.  He reports pain at the left knee for the past 2 weeks.  The knee was hot .  He was prescribed antibiotic and steroids by his primary provider.  He reports an x-ray was negative.  No bleeding.  Objective:  Vital signs in last 24 hours:  Blood pressure 115/70, pulse 78, temperature 97.9 F (36.6 C), temperature source Temporal, resp. rate 18, height 5' 11 (1.803 m), weight 199 lb 12.8 oz (90.6 kg), SpO2 97%.   Lymphatics: No cervical, supraclavicular, axillary, or inguinal nodes Resp: Lungs clear bilaterally Cardio: Regular rate and rhythm GI: No hepatosplenomegaly Vascular: No leg edema Musculoskeletal: Discomfort with flexion/extension of the left knee, no erythema or edema   Lab Results:  Lab Results  Component Value Date   WBC 1.4 (L) 03/26/2024   HGB 12.4 (L) 03/26/2024   HCT 35.1 (L) 03/26/2024   MCV 98.9 03/26/2024   PLT 101 (L) 03/26/2024   NEUTROABS 0.8 (L) 03/26/2024    CMP  Lab Results  Component Value Date   NA 142 04/30/2023   K 4.3 04/30/2023   CL 110 04/30/2023   CO2 21 (L) 04/30/2023   GLUCOSE 98 04/30/2023   BUN 18 04/30/2023   CREATININE 1.58 (H) 04/30/2023   CALCIUM  9.3 04/30/2023   PROT 7.0 03/18/2023   ALBUMIN 4.3 03/18/2023   AST 12 (L) 03/18/2023   ALT 13 03/18/2023   ALKPHOS 49 03/18/2023   BILITOT 0.5 03/18/2023   GFRNONAA 44 (L) 04/30/2023   GFRAA >60 04/25/2016    Medications: I have reviewed the patient's current medications.   Assessment/Plan: Leukopenia, thrombocytopenia, and Red cell macrocytosis CT Abdo/pelvis 04/01/2023: Normal spleen Ultrasound elastography liver 08/06/2023:kPa 1.8, high probability of being normal  Chronic renal insufficiency  3.   Report of esophagus cancer diagnosed greater  than 20 years ago and treated with local surgery followed by radiation  4.   Remote history of heavy tobacco use  5.   Current and chronic history of alcohol use  6.   Status post left lung surgery for removal of a a lung granuloma .  7.   BPH  8.   Admission in February 2018 with urosepsis  9.  Non-small cell lung cancer CTs 04/01/2023: A subsolid nodule in the left lung measures 8 x 6 cm with a new solid component, no pathologic enlarged lymph nodes.  No evidence of metastatic disease Biopsy of left lower lobe nodule 04/30/2023: Adenocarcinoma involving left lung nodule FNA biopsy and brushing, CK7 and TTF-1 positive PET 05/16/2023: Interval fiducial placement in the left upper lobe with a new nodule (non-hypermetabolic) along the anterior margin of the fiducial, indistinct left upper lobe nodule suspicious for low-grade adenocarcinoma-SUV 2.5, stable right upper lobe subpleural nodule without metabolic activity SBRT 06/18/2023 - 06/24/2023, 54 Gray in 3 fractions 10/21/2023 chest CT: Increased density of the peripheral left upper lobe lesion measuring 1.3 x 0.5 cm, previous less distinct measuring 1.6 x 0.8 cm, fiducial marker in the left upper lobe-I cannot appreciate a lesion immediately adjacent to the marker       Disposition: [name].  The plan is to proceed with a bone marrow biopsy if the cytopenias progress.  He will call for bleeding or a fever.  He is up-to-date on vaccines.  I reviewed the restaging chest CT images with [name].  There appear to be inflammatory changes surrounding the left lung lesion treated with SBRT.  We will wait on the final CT report.  He will return for an office and lab visit in 4 months.  Arley Hof, MD  03/26/2024  10:12 AM   "

## 2024-03-30 ENCOUNTER — Other Ambulatory Visit: Payer: Self-pay

## 2024-03-30 ENCOUNTER — Other Ambulatory Visit

## 2024-03-30 DIAGNOSIS — R972 Elevated prostate specific antigen [PSA]: Secondary | ICD-10-CM

## 2024-03-31 ENCOUNTER — Ambulatory Visit: Payer: Self-pay

## 2024-03-31 ENCOUNTER — Ambulatory Visit: Payer: Self-pay | Admitting: Oncology

## 2024-03-31 DIAGNOSIS — R972 Elevated prostate specific antigen [PSA]: Secondary | ICD-10-CM

## 2024-03-31 LAB — PSA: Prostate Specific Ag, Serum: 15.5 ng/mL — ABNORMAL HIGH (ref 0.0–4.0)

## 2024-04-02 NOTE — Telephone Encounter (Signed)
 Communicated CT findings to the patient.  No further questions at this time.

## 2024-04-02 NOTE — Telephone Encounter (Signed)
-----   Message from Arley Hof, MD sent at 03/31/2024  7:36 PM EST ----- Please call patient, CT shows no evidence of progressive cancer, radiation scarring at area of previous lung nodule

## 2024-04-03 ENCOUNTER — Ambulatory Visit: Admitting: Urology

## 2024-04-03 VITALS — BP 123/67 | HR 79

## 2024-04-03 DIAGNOSIS — R3915 Urgency of urination: Secondary | ICD-10-CM | POA: Diagnosis not present

## 2024-04-03 DIAGNOSIS — R972 Elevated prostate specific antigen [PSA]: Secondary | ICD-10-CM

## 2024-04-03 DIAGNOSIS — N401 Enlarged prostate with lower urinary tract symptoms: Secondary | ICD-10-CM | POA: Diagnosis not present

## 2024-04-03 LAB — URINALYSIS, ROUTINE W REFLEX MICROSCOPIC
Bilirubin, UA: NEGATIVE
Ketones, UA: NEGATIVE
Leukocytes,UA: NEGATIVE
Nitrite, UA: NEGATIVE
RBC, UA: NEGATIVE
Specific Gravity, UA: 1.02 (ref 1.005–1.030)
Urobilinogen, Ur: 0.2 mg/dL (ref 0.2–1.0)
pH, UA: 5.5 (ref 5.0–7.5)

## 2024-04-03 LAB — MICROSCOPIC EXAMINATION: Bacteria, UA: NONE SEEN

## 2024-04-03 MED ORDER — SILODOSIN 8 MG PO CAPS: 8.0000 mg | ORAL_CAPSULE | Freq: Every evening | ORAL | 11 refills | Status: AC

## 2024-04-03 NOTE — Progress Notes (Signed)
 "  04/03/2024 10:40 AM   Charlie Raw 09/05/1945 [id number]  Referring provider: Dyane Anthony RAMAN, FNP [address] [place],  [place] [address]  Elevated PSA  HPI: Mr Faulkenberry is a 79yo here for followup for BPH and elevated PSA. PSA increased to 15.5 from 9.8. He denies any worsening LUTS. IPSS 21 QOl 3 on rapaflo  8mg  daily. He has urinary frequency every 2 hours. Nocturia 2-3x. No dysuria or hematuria.    PMH: Past Medical History:  Diagnosis Date   Chronic ITP (idiopathic thrombocytopenia) (HCC)    Chronic leukopenia    CKD (chronic kidney disease)    DOE (dyspnea on exertion)    Dysuria    Emphysema of lung (HCC)    History of throat cancer    Hyperlipidemia    Hypertension     Surgical History: Past Surgical History:  Procedure Laterality Date   BRONCHIAL BIOPSY  04/30/2023   Procedure: BRONCHIAL BIOPSIES;  Surgeon: Shelah Lamar RAMAN, MD;  Location: [facility] ENDOSCOPY;  Service: Pulmonary;;   BRONCHIAL BRUSHINGS  04/30/2023   Procedure: BRONCHIAL BRUSHINGS;  Surgeon: Shelah Lamar RAMAN, MD;  Location: [facility] ENDOSCOPY;  Service: Pulmonary;;   BRONCHIAL NEEDLE ASPIRATION BIOPSY  04/30/2023   Procedure: BRONCHIAL NEEDLE ASPIRATION BIOPSIES;  Surgeon: Shelah Lamar RAMAN, MD;  Location: [facility] ENDOSCOPY;  Service: Pulmonary;;   FIDUCIAL MARKER PLACEMENT  04/30/2023   Procedure: FIDUCIAL MARKER PLACEMENT;  Surgeon: Shelah Lamar RAMAN, MD;  Location: [facility] ENDOSCOPY;  Service: Pulmonary;;   LUNG SURGERY     granuloma removed   SKIN CANCER EXCISION     multiple spots removed   THROAT SURGERY  2000   throat cancer    Home Medications:  Allergies as of 04/03/2024   No Known Allergies      Medication List        Accurate as of April 03, 2024 10:40 AM. If you have any questions, ask your nurse or doctor.          Farxiga 10 MG Tabs tablet Generic drug: dapagliflozin propanediol Take 10 mg by mouth daily.   levothyroxine  112 MCG tablet Commonly known as: SYNTHROID  Take  112 mcg by mouth daily.   omeprazole 20 MG capsule Commonly known as: PRILOSEC Take 20 mg by mouth 2 (two) times daily.   rosuvastatin  40 MG tablet Commonly known as: CRESTOR  Take 40 mg by mouth daily.   silodosin  8 MG Caps capsule Commonly known as: RAPAFLO  Take 1 capsule (8 mg total) by mouth at bedtime.   VITAMIN D2 PO Take 800 Units by mouth daily at 6 (six) AM.        Allergies: Allergies[1]  Family History: Family History  Problem Relation Age of Onset   Cancer Father     Social History:  reports that he quit smoking about 26 years ago. His smoking use included cigarettes. He has never used smokeless tobacco. He reports current alcohol use of about 4.0 standard drinks of alcohol per week. He reports that he does not use drugs.  ROS: All other review of systems were reviewed and are negative except what is noted above in HPI  Physical Exam: BP 123/67   Pulse 79   Constitutional:  Alert and oriented, No acute distress. HEENT: [place] AT, moist mucus membranes.  Trachea midline, no masses. Cardiovascular: No clubbing, cyanosis, or edema. Respiratory: Normal respiratory effort, no increased work of breathing. GI: Abdomen is soft, nontender, nondistended, no abdominal masses GU: No CVA tenderness.  Lymph: No cervical  or inguinal lymphadenopathy. Skin: No rashes, bruises or suspicious lesions. Neurologic: Grossly intact, no focal deficits, moving all 4 extremities. Psychiatric: Normal mood and affect.  Laboratory Data: Lab Results  Component Value Date   WBC 1.4 (L) 03/26/2024   HGB 12.4 (L) 03/26/2024   HCT 35.1 (L) 03/26/2024   MCV 98.9 03/26/2024   PLT 101 (L) 03/26/2024    Lab Results  Component Value Date   CREATININE 1.58 (H) 04/30/2023    No results found for: PSA  No results found for: TESTOSTERONE  No results found for: HGBA1C  Urinalysis    Component Value Date/Time   COLORURINE YELLOW 04/22/2016 2339   APPEARANCEUR Clear 09/23/2023  0852   LABSPEC 1.023 04/22/2016 2339   PHURINE 5.0 04/22/2016 2339   GLUCOSEU 3+ (A) 09/23/2023 0852   HGBUR MODERATE (A) 04/22/2016 2339   BILIRUBINUR Negative 09/23/2023 0852   KETONESUR 5 (A) 04/22/2016 2339   PROTEINUR Urine 09/23/2023 0852   PROTEINUR 30 (A) 04/22/2016 2339   UROBILINOGEN 0.2 11/20/2019 0859   NITRITE Negative 09/23/2023 0852   NITRITE POSITIVE (A) 04/22/2016 2339   LEUKOCYTESUR Negative 09/23/2023 0852    Lab Results  Component Value Date   LABMICR See below: 09/23/2023   WBCUA 0-5 09/23/2023   LABEPIT 0-10 09/23/2023   MUCUS Present 12/30/2020   BACTERIA None seen 09/23/2023    Pertinent Imaging:  No results found for this or any previous visit.  No results found for this or any previous visit.  No results found for this or any previous visit.  No results found for this or any previous visit.  Results for orders placed during the hospital encounter of 05/31/16  US  Renal  Narrative CLINICAL DATA:  Stage III chronic kidney disease, history hypertension, throat cancer, former smoker  EXAM: RENAL / URINARY TRACT ULTRASOUND COMPLETE  COMPARISON:  None  FINDINGS: Right Kidney:  Length: 11.6 cm. Mild age-related cortical thinning. Normal cortical echogenicity. No mass, hydronephrosis or shadowing calcification.  Left Kidney:  Length: 11.6 cm. Normal cortical echogenicity. Mild age-related renal cortical thinning. Small cystic lesion at upper pole 14 x 8 x 10 mm. No additional renal mass, hydronephrosis or shadowing calcification.  Bladder:  Normal appearance. BILATERAL ureteral jets visualized. Prostate gland appears enlarged 4.6 cm transverse by 2.8 cm AP, length inadequately establish but likely at least 4.4 cm.  IMPRESSION: Small LEFT renal cyst.  Age-related renal cortical thinning.  Prostatic enlargement.   Electronically Signed By: Oneil Kiss M.D. On: 05/31/2016 16:23  No results found for this or any previous  visit.  No results found for this or any previous visit.  No results found for this or any previous visit.   Assessment & Plan:    1. Elevated PSA (Primary) MRI prostate, will call with the results - Urinalysis, Routine w reflex microscopic  2. Benign localized prostatic hyperplasia with lower urinary tract symptoms (LUTS) Continue rapalfo 8mg  daily  3. Urinary urgency Continue rapalfo 8mg  daily   No follow-ups on file.  Belvie Clara, MD  [facility] [place]      [1] No Known Allergies  "

## 2024-04-07 ENCOUNTER — Encounter: Payer: Self-pay | Admitting: Urology

## 2024-04-07 NOTE — Patient Instructions (Signed)

## 2024-04-14 ENCOUNTER — Ambulatory Visit (HOSPITAL_COMMUNITY)
Admission: RE | Admit: 2024-04-14 | Discharge: 2024-04-14 | Disposition: A | Source: Ambulatory Visit | Attending: Urology

## 2024-04-14 MED ORDER — GADOBUTROL 1 MMOL/ML IV SOLN
9.0000 mL | Freq: Once | INTRAVENOUS | Status: AC | PRN
  Administered 2024-04-14: 9 mL via INTRAVENOUS

## 2024-07-23 ENCOUNTER — Inpatient Hospital Stay: Admitting: Oncology

## 2024-07-23 ENCOUNTER — Inpatient Hospital Stay
